# Patient Record
Sex: Male | Born: 1995 | Race: Black or African American | Hispanic: No | Marital: Single | Smoking: Never smoker
Health system: Southern US, Community
[De-identification: ages and names within clinical notes are randomized; demographics above are authoritative.]

## PROBLEM LIST (undated history)

## (undated) HISTORY — PX: NO PAST SURGERIES: SHX2092

---
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
## 2015-10-05 ENCOUNTER — Ambulatory Visit (INDEPENDENT_AMBULATORY_CARE_PROVIDER_SITE_OTHER): Payer: BLUE CROSS/BLUE SHIELD

## 2015-10-05 ENCOUNTER — Ambulatory Visit
Admission: EM | Admit: 2015-10-05 | Discharge: 2015-10-05 | Disposition: A | Discharge status: Home / Self Care | Payer: BLUE CROSS/BLUE SHIELD | Attending: Family Medicine | Admitting: Family Medicine

## 2015-10-05 DIAGNOSIS — S60131A Contusion of right middle finger with damage to nail, initial encounter: Secondary | ICD-10-CM | POA: Diagnosis not present

## 2015-10-05 NOTE — Discharge Instructions (Signed)
° °  Follow up with your primary care physician this week as needed. Return to Urgent care for new or worsening concerns.  ° °

## 2015-10-05 NOTE — ED Triage Notes (Signed)
Patient complains of right hand middle finger injury. Patient states that he slammed his finger in a car door and the pain has progressively got worse. Patient states that area is swollen and discolored.

## 2015-10-05 NOTE — ED Provider Notes (Signed)
[facility] ____________________________________________  Time seen: Approximately 1:00 PM  I have reviewed the triage vital signs and the nursing notes.   HISTORY  Chief Complaint Finger Injury (Right middle )   HPI Richard Love is a 20 y.o. male presents for evaluation of right middle finger pain. Patient reports present 2 weeks ago his right middle finger had a door closed on it. Patient reports he has had pain to the nail area that finger since. Patient reports pain is mild. Patient reports pain is primarily when directly palpated or with something pressing against the area. Reports he is right-hand dominant. Denies any other pain or injuries. Reports has continued to use hand without restrictions.  Denies any numbness or tingling sensation. Denies any decreased range of motion. Denies pain radiation. Denies any other pain. Denies fall, head injury or loss consciousness.   History reviewed. No pertinent past medical history.  There are no active problems to display for this patient.   Past Surgical History:  Procedure Laterality Date  . NO PAST SURGERIES        No current facility-administered medications for this encounter.  No current outpatient prescriptions on file.  Allergies Review of patient's allergies indicates no known allergies.  History reviewed. No pertinent family history.  Social History Social History  Substance Use Topics  . Smoking status: Never Smoker  . Smokeless tobacco: Never Used  . Alcohol use No    Review of Systems Constitutional: No fever/chills Eyes: No visual changes. ENT: No sore throat. Cardiovascular: Denies chest pain. Respiratory: Denies shortness of breath. Gastrointestinal: No abdominal pain.  No nausea, no vomiting.  No diarrhea.  No constipation. Genitourinary: Negative for dysuria. Musculoskeletal: Negative for back pain. Skin: Negative for rash. Neurological: Negative for headaches, focal weakness or  numbness.  10-point ROS otherwise negative.  ____________________________________________   PHYSICAL EXAM:  VITAL SIGNS: ED Triage Vitals  Enc Vitals Group     BP 10/05/15 1104 123/70     Pulse Rate 10/05/15 1104 81     Resp 10/05/15 1104 16     Temp 10/05/15 1104 97.7 F (36.5 C)     Temp Source 10/05/15 1104 Tympanic     SpO2 10/05/15 1104 100 %     Weight 10/05/15 1104 194 lb (88 kg)     Height 10/05/15 1104 5\' 11"  (1.803 m)     Head Circumference --      Peak Flow --      Pain Score 10/05/15 1106 7     Pain Loc --      Pain Edu? --      Excl. in [place]? --     Constitutional: Alert and oriented. Well appearing and in no acute distress. Eyes: Conjunctivae are normal. PERRL. EOMI. ENT      Head: Normocephalic and atraumatic.      Mouth/Throat: Mucous membranes are moist Cardiovascular: Normal rate, regular rhythm. Grossly normal heart sounds.  Good peripheral circulation. Respiratory: Normal respiratory effort without tachypnea nor retractions. Breath sounds are clear and equal bilaterally. No wheezes/rales/rhonchi.. Musculoskeletal: Ambulatory with steady gait. Except: Right third distal digit mild pain to direct palpation at distal phalanx, subungual hematoma noted, skin intact, no motor or tendon deficit, right hand otherwise nontender, bilateral hand grips strong and equal.  Neurologic:  Normal speech and language. No gross focal neurologic deficits are appreciated. Speech is normal. No gait instability.  Skin:  Skin is warm, dry and intact. No rash noted. Psychiatric: Mood and affect are  normal. Speech and behavior are normal. Patient exhibits appropriate insight and judgment   ___________________________________________   LABS (all labs ordered are listed, but only abnormal results are displayed)  Labs Reviewed - No data to display ____________________________________________  RADIOLOGY  Dg Finger Middle Right  Result Date: 10/05/2015 CLINICAL DATA:  Slammed  in car door. Middle finger right hand pain. Initial encounter. EXAM: RIGHT MIDDLE FINGER 2+V COMPARISON:  None. FINDINGS: No acute fracture or dislocation. Chronic ossicle/ calcification at the MCP joint. No opaque foreign body. IMPRESSION: No acute finding Electronically Signed   By: Monte Fantasia M.D.   On: 10/05/2015 11:44   ____________________________________________   PROCEDURES Procedures     INITIAL IMPRESSION / ASSESSMENT AND PLAN / ED COURSE  Pertinent labs & imaging results that were available during my care of the patient were reviewed by me and considered in my medical decision making (see chart for details).  Well appearing patient. No acute distress. Presents for the complaints of 2 weeks of right distal middle finger pain. Patient with right third digit subungual hematoma. Right third digit x-ray no acute finding per radiologist. Discussed in detail with patient encouraged supportive care.  Discussed follow up with Primary care physician this week. Discussed follow up and return parameters including no resolution or any worsening concerns. Patient verbalized understanding and agreed to plan.   ____________________________________________   FINAL CLINICAL IMPRESSION(S) / ED DIAGNOSES  Final diagnoses:  Hematoma, subungual, third finger, right, initial encounter     There are no discharge medications for this patient.   Note: This dictation was prepared with Dragon dictation along with smaller phrase technology. Any transcriptional errors that result from this process are unintentional.    Clinical Course      Marylene Land, NP 10/05/15 1305

## 2018-07-03 ENCOUNTER — Encounter (INDEPENDENT_AMBULATORY_CARE_PROVIDER_SITE_OTHER): Payer: Self-pay

## 2018-07-03 ENCOUNTER — Telehealth: Payer: BLUE CROSS/BLUE SHIELD | Admitting: Nurse Practitioner

## 2018-07-03 DIAGNOSIS — Z20822 Contact with and (suspected) exposure to covid-19: Secondary | ICD-10-CM

## 2018-07-03 MED ORDER — BENZONATATE 100 MG PO CAPS: 100.0000 mg | ORAL_CAPSULE | Freq: Three times a day (TID) | ORAL | 0 refills | Status: DC | PRN

## 2018-07-03 NOTE — Progress Notes (Signed)
E-Visit for Corona Virus Screening   Your current symptoms could be consistent with the coronavirus.  Call your health care provider or local health department to request and arrange formal testing. Many health care providers can now test patients at their office but not all are.  Please quarantine yourself while awaiting your test results.  [place] [phone], [place], [place] or visit [email or web address]     COVID-19 is a respiratory illness with symptoms that are similar to the flu. Symptoms are typically mild to moderate, but there have been cases of severe illness and death due to the virus. The following symptoms may appear 2-14 days after exposure: . Fever . Cough . Shortness of breath or difficulty breathing . Chills . Repeated shaking with chills . Muscle pain . Headache . Sore throat . New loss of taste or smell . Fatigue . Congestion or runny nose . Nausea or vomiting . Diarrhea  It is vitally important that if you feel that you have an infection such as this virus or any other virus that you stay home and away from places where you may spread it to others.  You should self-quarantine for 14 days if you have symptoms that could potentially be coronavirus or have been in close contact a with a person diagnosed with COVID-19 within the last 2 weeks. You should avoid contact with people age 14 and older.   You should wear a mask or cloth face covering over your nose and mouth if you must be around other people or animals, including pets (even at home). Try to stay at least 6 feet away from other people. This will protect the people around you.  You can use medication such as A prescription cough medication called Tessalon Perles 100 mg. You may take 1-2 capsules every 8 hours as needed for cough  You may also take  acetaminophen (Tylenol) as needed for fever.   Reduce your risk of any infection by using the same precautions used for avoiding the common cold or flu:  Marland Kitchen Wash your hands often with soap and warm water for at least 20 seconds.  If soap and water are not readily available, use an alcohol-based hand sanitizer with at least 60% alcohol.  . If coughing or sneezing, cover your mouth and nose by coughing or sneezing into the elbow areas of your shirt or coat, into a tissue or into your sleeve (not your hands). . Avoid shaking hands with others and consider head nods or verbal greetings only. . Avoid touching your eyes, nose, or mouth with unwashed hands.  . Avoid close contact with people who are sick. . Avoid places or events with large numbers of people in one location, like concerts or sporting events. . Carefully consider travel plans you have or are making. . If you are planning any travel outside or inside the [place], visit the CDC's Travelers' Health webpage for the latest health notices. . If you have some symptoms but not all symptoms, continue to monitor at home and seek medical attention if your symptoms worsen. . If you are having a medical emergency, call 911.  HOME CARE . Only take medications as instructed by your medical team. . Drink plenty of fluids and get plenty of rest. . A steam or ultrasonic humidifier can help if you have congestion.   GET HELP RIGHT AWAY IF YOU HAVE EMERGENCY WARNING SIGNS** FOR COVID-19. If you or someone is showing  any of these signs seek emergency medical care immediately. Call 911 or proceed to your closest emergency facility if: . You develop worsening high fever. . Trouble breathing . Bluish lips or face . Persistent pain or pressure in the chest . New confusion . Inability to wake or stay awake . You cough up blood. . Your symptoms become more severe  **This list is not all possible symptoms. Contact your medical provider for any symptoms that are  sever or concerning to you.   MAKE SURE YOU   Understand these instructions.  Will watch your condition.  Will get help right away if you are not doing well or get worse.  Your e-visit answers were reviewed by a board certified advanced clinical practitioner to complete your personal care plan.  Depending on the condition, your plan could have included both over the counter or prescription medications.  If there is a problem please reply once you have received a response from your provider.  Your safety is important to [place].  If you have drug allergies check your prescription carefully.    You can use MyChart to ask questions about today's visit, request a non-urgent call back, or ask for a work or school excuse for 24 hours related to this e-Visit. If it has been greater than 24 hours you will need to follow up with your provider, or enter a new e-Visit to address those concerns. You will get an e-mail in the next two days asking about your experience.  I hope that your e-visit has been valuable and will speed your recovery. Thank you for using e-visits.  5-10 minutes spent reviewing and documenting in chart.

## 2018-07-04 ENCOUNTER — Encounter (INDEPENDENT_AMBULATORY_CARE_PROVIDER_SITE_OTHER): Payer: Self-pay

## 2018-07-05 ENCOUNTER — Telehealth: Payer: Self-pay | Admitting: *Deleted

## 2018-07-05 ENCOUNTER — Encounter (INDEPENDENT_AMBULATORY_CARE_PROVIDER_SITE_OTHER): Payer: Self-pay

## 2018-07-05 NOTE — Telephone Encounter (Signed)
BPA received via MyChart Covid-19 symptom monitoring for new onset diarrhea with worsening weakness and appetite. Attempted to call pt to discuss symptoms but no answer at this time. Unable to leave message on voicemail due to the mailbox being full. MyChart message sent to pt with the following recommendations:For diarrhea and worsening appetite try to drink fluids as tolerated and work you way up to bland solid foods such as crackers, pretzels,soup,bread or boiled starches. Avoid alcohol, caffeine, fatty or spicy foods that could make diarrhea worse. You may also try over the counter medications if diarrhea continues. If experience weakness with inability to stand or if you have to hold on to something  to get your balance please call 911 and seek treatment in the ED. If you are not able to tolerate foods or liquids please contact your PCP.   Advised pt to contact PCP's office if no improvement of symptoms.

## 2018-07-06 ENCOUNTER — Telehealth: Payer: Self-pay | Admitting: *Deleted

## 2018-07-06 ENCOUNTER — Encounter (INDEPENDENT_AMBULATORY_CARE_PROVIDER_SITE_OTHER): Payer: Self-pay

## 2018-07-06 NOTE — Telephone Encounter (Signed)
Attempted to contact pt due to my chart companion response 07/06/2018 of worsening weakness; left message on voicemail.

## 2018-07-07 ENCOUNTER — Encounter (INDEPENDENT_AMBULATORY_CARE_PROVIDER_SITE_OTHER): Payer: Self-pay

## 2018-07-08 ENCOUNTER — Encounter (INDEPENDENT_AMBULATORY_CARE_PROVIDER_SITE_OTHER): Payer: Self-pay

## 2018-07-10 ENCOUNTER — Telehealth: Payer: Self-pay | Admitting: Internal Medicine

## 2018-07-10 ENCOUNTER — Encounter (INDEPENDENT_AMBULATORY_CARE_PROVIDER_SITE_OTHER): Payer: Self-pay

## 2018-07-10 NOTE — Telephone Encounter (Signed)
Pt triggered BPA with new onset of vomiting. Discussed MyChart Covid 19 protocol for vomiting and when to go to ED. Pt verbalized understanding.

## 2018-07-11 ENCOUNTER — Encounter (INDEPENDENT_AMBULATORY_CARE_PROVIDER_SITE_OTHER): Payer: Self-pay

## 2018-07-13 ENCOUNTER — Encounter (INDEPENDENT_AMBULATORY_CARE_PROVIDER_SITE_OTHER): Payer: Self-pay

## 2018-07-13 ENCOUNTER — Telehealth: Payer: Self-pay

## 2018-07-13 NOTE — Telephone Encounter (Signed)
Received MyChart message from patient with complaint of "bad" acid reflux.  Left HIPAA compliant voice message and sent message through Epic for call back.

## 2018-07-14 ENCOUNTER — Encounter (INDEPENDENT_AMBULATORY_CARE_PROVIDER_SITE_OTHER): Payer: Self-pay

## 2018-07-14 ENCOUNTER — Telehealth: Payer: Self-pay | Admitting: *Deleted

## 2018-07-14 NOTE — Telephone Encounter (Signed)
attempted to contact pt due to my chart companion response 07/14/2018 of new weakness; left message on voicemail.

## 2018-08-16 ENCOUNTER — Encounter: Payer: Self-pay | Admitting: Family

## 2018-08-16 ENCOUNTER — Telehealth: Payer: BLUE CROSS/BLUE SHIELD | Admitting: Family

## 2018-08-16 DIAGNOSIS — Z20822 Contact with and (suspected) exposure to covid-19: Secondary | ICD-10-CM

## 2018-08-16 NOTE — Progress Notes (Signed)
E-Visit for Corona Virus Screening   Your current symptoms could be consistent with the coronavirus.  Many health care providers can now test patients at their office but not all are.  [place] has multiple testing sites. For information on our COVID testing locations and hours go to [email or web address]  Please quarantine yourself while awaiting your test results.  We are enrolling you in our [place] for [place] . Daily you will receive a questionnaire within the [place] website. Our COVID 19 response team willl be monitoriing your responses daily.    COVID-19 is a respiratory illness with symptoms that are similar to the flu. Symptoms are typically mild to moderate, but there have been cases of severe illness and death due to the virus. The following symptoms may appear 2-14 days after exposure: . Fever . Cough . Shortness of breath or difficulty breathing . Chills . Repeated shaking with chills . Muscle pain . Headache . Sore throat . New loss of taste or smell . Fatigue . Congestion or runny nose . Nausea or vomiting . Diarrhea  It is vitally important that if you feel that you have an infection such as this virus or any other virus that you stay home and away from places where you may spread it to others.  You should self-quarantine for 14 days if you have symptoms that could potentially be coronavirus or have been in close contact a with a person diagnosed with COVID-19 within the last 2 weeks. You should avoid contact with people age 17 and older.   You should wear a mask or cloth face covering over your nose and mouth if you must be around other people or animals, including pets (even at home). Try to stay at least 6 feet away from other people. This will protect the people around you.   You may also take acetaminophen (Tylenol) as needed for fever.   Reduce your risk of any infection by using the same precautions used for avoiding  the common cold or flu:  Marland Kitchen Wash your hands often with soap and warm water for at least 20 seconds.  If soap and water are not readily available, use an alcohol-based hand sanitizer with at least 60% alcohol.  . If coughing or sneezing, cover your mouth and nose by coughing or sneezing into the elbow areas of your shirt or coat, into a tissue or into your sleeve (not your hands). . Avoid shaking hands with others and consider head nods or verbal greetings only. . Avoid touching your eyes, nose, or mouth with unwashed hands.  . Avoid close contact with people who are sick. . Avoid places or events with large numbers of people in one location, like concerts or sporting events. . Carefully consider travel plans you have or are making. . If you are planning any travel outside or inside the [place], visit the CDC's Travelers' Health webpage for the latest health notices. . If you have some symptoms but not all symptoms, continue to monitor at home and seek medical attention if your symptoms worsen. . If you are having a medical emergency, call 911.  HOME CARE . Only take medications as instructed by your medical team. . Drink plenty of fluids and get plenty of rest. . A steam or ultrasonic humidifier can help if you have congestion.   GET HELP RIGHT AWAY IF YOU HAVE EMERGENCY WARNING SIGNS** FOR COVID-19. If you or someone is showing any of these signs seek emergency medical care immediately.  Call 911 or proceed to your closest emergency facility if: . You develop worsening high fever. . Trouble breathing . Bluish lips or face . Persistent pain or pressure in the chest . New confusion . Inability to wake or stay awake . You cough up blood. . Your symptoms become more severe  **This list is not all possible symptoms. Contact your medical provider for any symptoms that are sever or concerning to you.   MAKE SURE YOU   Understand these instructions.  Will watch your condition.  Will get help  right away if you are not doing well or get worse.  Your e-visit answers were reviewed by a board certified advanced clinical practitioner to complete your personal care plan.  Depending on the condition, your plan could have included both over the counter or prescription medications.  If there is a problem please reply once you have received a response from your provider.  Your safety is important to [place].  If you have drug allergies check your prescription carefully.    You can use MyChart to ask questions about today's visit, request a non-urgent call back, or ask for a work or school excuse for 24 hours related to this e-Visit. If it has been greater than 24 hours you will need to follow up with your provider, or enter a new e-Visit to address those concerns. You will get an e-mail in the next two days asking about your experience.  I hope that your e-visit has been valuable and will speed your recovery. Thank you for using e-visits.   Greater than 5 minutes, yet less than 10 minutes of time have been spent researching, coordinating, and implementing care for this patient today.  Thank you for the details you included in the comment boxes. Those details are very helpful in determining the best course of treatment for you and help [place] to provide the best care.

## 2018-10-21 ENCOUNTER — Telehealth: Payer: BLUE CROSS/BLUE SHIELD | Admitting: Physician Assistant

## 2018-10-21 DIAGNOSIS — Z20828 Contact with and (suspected) exposure to other viral communicable diseases: Secondary | ICD-10-CM

## 2018-10-21 DIAGNOSIS — R058 Other specified cough: Secondary | ICD-10-CM

## 2018-10-21 DIAGNOSIS — Z20822 Contact with and (suspected) exposure to covid-19: Secondary | ICD-10-CM

## 2018-10-21 DIAGNOSIS — R0602 Shortness of breath: Secondary | ICD-10-CM

## 2018-10-21 DIAGNOSIS — R059 Cough, unspecified: Secondary | ICD-10-CM

## 2018-10-21 DIAGNOSIS — R05 Cough: Secondary | ICD-10-CM

## 2018-10-21 MED ORDER — BENZONATATE 100 MG PO CAPS: 100.0000 mg | ORAL_CAPSULE | Freq: Three times a day (TID) | ORAL | 0 refills | Status: DC | PRN

## 2018-10-21 MED ORDER — ALBUTEROL SULFATE HFA 108 (90 BASE) MCG/ACT IN AERS: 2.0000 | INHALATION_SPRAY | RESPIRATORY_TRACT | 0 refills | Status: DC | PRN

## 2018-10-21 NOTE — Progress Notes (Signed)
E-Visit for [employer] Virus Screening   Hi Fed,  I'm sorry you are not feeling well.  Please read below (link to website) to learn where our most convenient testing sites are for you. I have also prescribed a few medications to help you feel better.  You do not need an appointment or an order for the COVID-19 test.   I will send you a work note via Pharmacist, community.   Your current symptoms could be consistent with the coronavirus.  Many health care providers can now test patients at their office but not all are.  [place] has multiple testing sites. For information on our COVID testing locations and hours go to [email or web address]  Please quarantine yourself while awaiting your test results.  We are enrolling you in our [place] for [place] . Daily you will receive a questionnaire within the [place] website. Our COVID 19 response team willl be monitoriing your responses daily.    COVID-19 is a respiratory illness with symptoms that are similar to the flu. Symptoms are typically mild to moderate, but there have been cases of severe illness and death due to the virus. The following symptoms may appear 2-14 days after exposure: . Fever . Cough . Shortness of breath or difficulty breathing . Chills . Repeated shaking with chills . Muscle pain . Headache . Sore throat . New loss of taste or smell . Fatigue . Congestion or runny nose . Nausea or vomiting . Diarrhea  It is vitally important that if you feel that you have an infection such as this virus or any other virus that you stay home and away from places where you may spread it to others.  You should self-quarantine for 14 days if you have symptoms that could potentially be coronavirus or have been in close contact a with a person diagnosed with COVID-19 within the last 2 weeks. You should avoid contact with people age 79 and older.   You should wear a mask or cloth face covering over your nose and mouth  if you must be around other people or animals, including pets (even at home). Try to stay at least 6 feet away from other people. This will protect the people around you.  You can use medication such as A prescription cough medication called Tessalon Perles 100 mg. You may take 1-2 capsules every 8 hours as needed for cough and A prescription inhaler called Albuterol MDI 90 mcg /actuation 2 puffs every 4 hours as needed for shortness of breath, wheezing, cough  You may also take acetaminophen (Tylenol) as needed for fever.   Reduce your risk of any infection by using the same precautions used for avoiding the common cold or flu:  Marland Kitchen Wash your hands often with soap and warm water for at least 20 seconds.  If soap and water are not readily available, use an alcohol-based hand sanitizer with at least 60% alcohol.  . If coughing or sneezing, cover your mouth and nose by coughing or sneezing into the elbow areas of your shirt or coat, into a tissue or into your sleeve (not your hands). . Avoid shaking hands with others and consider head nods or verbal greetings only. . Avoid touching your eyes, nose, or mouth with unwashed hands.  . Avoid close contact with people who are sick. . Avoid places or events with large numbers of people in one location, like concerts or sporting events. . Carefully consider travel plans you have or are making. . If you  are planning any travel outside or inside the [place], visit the CDC's Travelers' Health webpage for the latest health notices. . If you have some symptoms but not all symptoms, continue to monitor at home and seek medical attention if your symptoms worsen. . If you are having a medical emergency, call 911.  HOME CARE . Only take medications as instructed by your medical team. . Drink plenty of fluids and get plenty of rest. . A steam or ultrasonic humidifier can help if you have congestion.   GET HELP RIGHT AWAY IF YOU HAVE EMERGENCY WARNING SIGNS** FOR  COVID-19. If you or someone is showing any of these signs seek emergency medical care immediately. Call 911 or proceed to your closest emergency facility if: . You develop worsening high fever. . Trouble breathing . Bluish lips or face . Persistent pain or pressure in the chest . New confusion . Inability to wake or stay awake . You cough up blood. . Your symptoms become more severe  **This list is not all possible symptoms. Contact your medical provider for any symptoms that are sever or concerning to you.   MAKE SURE YOU   Understand these instructions.  Will watch your condition.  Will get help right away if you are not doing well or get worse.  Your e-visit answers were reviewed by a board certified advanced clinical practitioner to complete your personal care plan.  Depending on the condition, your plan could have included both over the counter or prescription medications.  If there is a problem please reply once you have received a response from your provider.  Your safety is important to [place].  If you have drug allergies check your prescription carefully.    You can use MyChart to ask questions about today's visit, request a non-urgent call back, or ask for a work or school excuse for 24 hours related to this e-Visit. If it has been greater than 24 hours you will need to follow up with your provider, or enter a new e-Visit to address those concerns. You will get an e-mail in the next two days asking about your experience.  I hope that your e-visit has been valuable and will speed your recovery. Thank you for using e-visits.   Greater than 5 minutes, yet less than 10 minutes of time have been spent researching, coordinating and implementing care for this patient today.

## 2018-12-03 ENCOUNTER — Emergency Department
Admission: EM | Admit: 2018-12-03 | Discharge: 2018-12-03 | Disposition: A | Discharge status: Home / Self Care | Payer: Self-pay | Attending: Student | Admitting: Student

## 2018-12-03 ENCOUNTER — Other Ambulatory Visit: Payer: Self-pay

## 2018-12-03 ENCOUNTER — Encounter: Payer: Self-pay | Admitting: Emergency Medicine

## 2018-12-03 DIAGNOSIS — R202 Paresthesia of skin: Secondary | ICD-10-CM | POA: Insufficient documentation

## 2018-12-03 DIAGNOSIS — R6889 Other general symptoms and signs: Secondary | ICD-10-CM

## 2018-12-03 LAB — BASIC METABOLIC PANEL
Anion gap: 11 (ref 5–15)
BUN: 9 mg/dL (ref 6–20)
CO2: 23 mmol/L (ref 22–32)
Calcium: 9.3 mg/dL (ref 8.9–10.3)
Chloride: 105 mmol/L (ref 98–111)
Creatinine, Ser: 0.71 mg/dL (ref 0.61–1.24)
GFR calc Af Amer: >60 mL/min (ref >60)
GFR calc non Af Amer: >60 mL/min (ref >60)
Glucose, Bld: 136 mg/dL — ABNORMAL HIGH (ref 70–99)
Potassium: 3.2 mmol/L — ABNORMAL LOW (ref 3.5–5.1)
Sodium: 139 mmol/L (ref 135–145)

## 2018-12-03 LAB — CBC WITH DIFFERENTIAL/PLATELET
Abs Immature Granulocytes: 0.03 10*3/uL (ref 0.00–0.07)
Basophils Absolute: 0 10*3/uL (ref 0.0–0.1)
Basophils Relative: 0 %
Eosinophils Absolute: 0 10*3/uL (ref 0.0–0.5)
Eosinophils Relative: 0 %
HCT: 41.1 % (ref 39.0–52.0)
Hemoglobin: 14.7 g/dL (ref 13.0–17.0)
Immature Granulocytes: 0 %
Lymphocytes Relative: 18 %
Lymphs Abs: 1.9 10*3/uL (ref 0.7–4.0)
MCH: 30.4 pg (ref 26.0–34.0)
MCHC: 35.8 g/dL (ref 30.0–36.0)
MCV: 85.1 fL (ref 80.0–100.0)
Monocytes Absolute: 0.8 10*3/uL (ref 0.1–1.0)
Monocytes Relative: 8 %
Neutro Abs: 7.7 10*3/uL (ref 1.7–7.7)
Neutrophils Relative %: 74 %
Platelets: 311 10*3/uL (ref 150–400)
RBC: 4.83 MIL/uL (ref 4.22–5.81)
RDW: 12.3 % (ref 11.5–15.5)
WBC: 10.5 10*3/uL (ref 4.0–10.5)
nRBC: 0 % (ref 0.0–0.2)

## 2018-12-03 MED ORDER — KETOROLAC TROMETHAMINE 30 MG/ML IJ SOLN
15.0000 mg | Freq: Once | INTRAMUSCULAR | Status: AC
  Administered 2018-12-03: 15 mg via INTRAVENOUS
  Filled 2018-12-03: qty 1

## 2018-12-03 MED ORDER — SODIUM CHLORIDE 0.9 % IV BOLUS
1000.0000 mL | Freq: Once | INTRAVENOUS | Status: AC
  Administered 2018-12-03: 17:00:00 1000 mL via INTRAVENOUS

## 2018-12-03 MED ORDER — POTASSIUM CHLORIDE CRYS ER 20 MEQ PO TBCR
40.0000 meq | EXTENDED_RELEASE_TABLET | Freq: Once | ORAL | Status: AC
  Administered 2018-12-03: 17:00:00 40 meq via ORAL
  Filled 2018-12-03: qty 2

## 2018-12-03 NOTE — ED Triage Notes (Signed)
Pt to ED via POV c/o tingling in bilateral hands and feet. Pt states that this has been going on for a few days but it got worse today. Pt states that he is also having headache and lightheadedness. Pt is in NAD at this time.

## 2018-12-03 NOTE — ED Notes (Signed)
EDP [facility] at bedside. EKG to [facility].

## 2018-12-03 NOTE — Discharge Instructions (Addendum)
Please eat a healthy, balanced diet.  Please obtain an over-the-counter multivitamin (that includes [id number] and folic acid) and take daily as directed.  Follow up with your general doctor.   Return to the ER for any new or worsening symptoms.

## 2018-12-03 NOTE — ED Provider Notes (Signed)
[facility] Emergency Department Provider Note  ____________________________________________   First MD Initiated Contact with Patient 12/03/18 1654     (approximate)  I have reviewed the triage vital signs and the nursing notes.  History  Chief Complaint Tingling    HPI Richard Love is a 23 y.o. male who presents emergency department with multiple complaints.  Patient states he started feeling generally unwell a few days ago, but could not identify and specific symptoms.  Today when he was driving his car he felt like his hands and his feet were tingling, which prompted him to seek care.  He specifically denies any associated headache, vision changes, chest pain, weakness.  He denies any fevers, cough, exposure to sick contacts.  He did feel mild shortness of breath earlier, for which he has used his inhaler. He denies any SOB currently. He denies any carbon monoxide exposure, does not use a generator for heat.  He states he only eats fast food, and eats very little fruits or vegetables.   Past Medical Hx History reviewed. No pertinent past medical history.  Problem List There are no active problems to display for this patient.   Past Surgical Hx Past Surgical History:  Procedure Laterality Date  . NO PAST SURGERIES      Medications Prior to Admission medications   Medication Sig Start Date End Date Taking? Authorizing Provider  albuterol (VENTOLIN HFA) 108 (90 Base) MCG/ACT inhaler Inhale 2 puffs into the lungs every 4 (four) hours as needed for wheezing or shortness of breath (cough, shortness of breath or wheezing.). 10/21/18   McVey, Gelene Mink, PA-C  benzonatate (TESSALON PERLES) 100 MG capsule Take 1 capsule (100 mg total) by mouth 3 (three) times daily as needed. 10/21/18   McVey, Gelene Mink, PA-C    Allergies Patient has no known allergies.  Family Hx No family history on file.  Social Hx Social History   Tobacco Use   . Smoking status: Never Smoker  . Smokeless tobacco: Never Used  Substance Use Topics  . Alcohol use: No  . Drug use: No     Review of Systems  Constitutional: Negative for fever, chills. + generally unwell Eyes: Negative for visual changes. ENT: Negative for sore throat. Cardiovascular: Negative for chest pain. Respiratory: Negative for shortness of breath. Gastrointestinal: Negative for nausea, vomiting.  Genitourinary: Negative for dysuria. Musculoskeletal: Negative for leg swelling. Skin: Negative for rash. Neurological: Negative for for headaches. + tingling   Physical Exam  Vital Signs: ED Triage Vitals  Enc Vitals Group     BP 12/03/18 1551 (!) 164/85     Pulse Rate 12/03/18 1551 (!) 113     Resp 12/03/18 1551 16     Temp 12/03/18 1551 99.4 F (37.4 C)     Temp Source 12/03/18 1551 Oral     SpO2 12/03/18 1551 98 %     Weight 12/03/18 1552 200 lb (90.7 kg)     Height 12/03/18 1552 5\' 11"  (1.803 m)     Head Circumference --      Peak Flow --      Pain Score 12/03/18 1551 0     Pain Loc --      Pain Edu? --      Excl. in [place]? --     Constitutional: Alert and oriented.  Head: Normocephalic. Atraumatic. Eyes: Conjunctivae clear. Sclera anicteric. Nose: No congestion. No rhinorrhea. Mouth/Throat: Wearing mask.  Neck: No stridor.   Cardiovascular: Normal rate, regular  rhythm. Extremities well perfused. Respiratory: Normal respiratory effort.  Lungs CTAB. Gastrointestinal: Soft. Non-tender. Non-distended.  Musculoskeletal: No lower extremity edema. No deformities. Neurologic:  Normal speech and language. No gross focal neurologic deficits are appreciated. Alert and oriented.  Face symmetric.  Tongue midline.  Cranial nerves II through XII intact. UE and LE strength 5/5 and symmetric. UE and LE SILT.  Skin: Skin is warm, dry and intact. No rash noted. Psychiatric: Mood and affect are appropriate for situation.  EKG  Personally reviewed.   Rate: 91 Rhythm:  sinus Axis: normal Intervals: WNL No evidence of Brugada, WPW, or prolonged QTc No STEMI    Radiology  N/A   Procedures  Procedure(s) performed (including critical care):  Procedures   Initial Impression / Assessment and Plan / ED Course  23 y.o. male who presents to the ED for feeling generally unwell, and tingling in his hands/feet.  Ddx: electrolyte abnormality, dehydration, substance use. He does report very unhealthy diet and could have a B12 or folate deficiency as etiology of his tingling. He has not had an CO exposure.  Labs reveal mild hypokalemia. Will replete and give IVF. Remainder of labs w/o actionable derangements. EKG w/o ischemia or arrhythmia. Advised improving his diet, adequate hydration, and addition of a multivitamin. He voices understanding and is comfortable with the plan. Stable for discharge.    Final Clinical Impression(s) / ED Diagnosis  Final diagnoses:  Tingling  Feeling unwell       Note:  This document was prepared using Dragon voice recognition software and may include unintentional dictation errors.   Lilia Pro., MD 12/03/18 2125

## 2018-12-03 NOTE — ED Notes (Addendum)
See triage note. Pt reports episodes of tingling in both hands and lightheadedness. Denies HA, vision changes, and CP. Pt calmly sitting in bed. Pt in NAD. A&Ox4. Resp: reg/unlabored.

## 2019-01-03 ENCOUNTER — Inpatient Hospital Stay
Admission: RE | Admit: 2019-01-03 | Discharge: 2019-01-03 | Disposition: A | Discharge status: Home / Self Care | Payer: Medicaid Other | Source: Ambulatory Visit

## 2019-01-04 ENCOUNTER — Encounter: Payer: Self-pay | Admitting: Emergency Medicine

## 2019-01-04 ENCOUNTER — Other Ambulatory Visit: Payer: Self-pay

## 2019-01-04 ENCOUNTER — Ambulatory Visit: Payer: Self-pay

## 2019-01-04 ENCOUNTER — Ambulatory Visit
Admission: EM | Admit: 2019-01-04 | Discharge: 2019-01-04 | Disposition: A | Discharge status: Home / Self Care | Payer: Self-pay | Attending: Emergency Medicine | Admitting: Emergency Medicine

## 2019-01-04 DIAGNOSIS — R109 Unspecified abdominal pain: Secondary | ICD-10-CM

## 2019-01-04 DIAGNOSIS — K5900 Constipation, unspecified: Secondary | ICD-10-CM

## 2019-01-04 LAB — CBC WITH DIFFERENTIAL/PLATELET
Abs Immature Granulocytes: 0.02 10*3/uL (ref 0.00–0.07)
Basophils Absolute: 0 10*3/uL (ref 0.0–0.1)
Basophils Relative: 0 %
Eosinophils Absolute: 0.1 10*3/uL (ref 0.0–0.5)
Eosinophils Relative: 1 %
HCT: 42.8 % (ref 39.0–52.0)
Hemoglobin: 14.8 g/dL (ref 13.0–17.0)
Immature Granulocytes: 0 %
Lymphocytes Relative: 20 %
Lymphs Abs: 1.6 10*3/uL (ref 0.7–4.0)
MCH: 30.2 pg (ref 26.0–34.0)
MCHC: 34.6 g/dL (ref 30.0–36.0)
MCV: 87.3 fL (ref 80.0–100.0)
Monocytes Absolute: 0.6 10*3/uL (ref 0.1–1.0)
Monocytes Relative: 8 %
Neutro Abs: 5.7 10*3/uL (ref 1.7–7.7)
Neutrophils Relative %: 71 %
Platelets: 301 10*3/uL (ref 150–400)
RBC: 4.9 MIL/uL (ref 4.22–5.81)
RDW: 12.7 % (ref 11.5–15.5)
WBC: 8 10*3/uL (ref 4.0–10.5)
nRBC: 0 % (ref 0.0–0.2)

## 2019-01-04 LAB — BASIC METABOLIC PANEL
Anion gap: 13 (ref 5–15)
BUN: 13 mg/dL (ref 6–20)
CO2: 22 mmol/L (ref 22–32)
Calcium: 9.6 mg/dL (ref 8.9–10.3)
Chloride: 101 mmol/L (ref 98–111)
Creatinine, Ser: 0.69 mg/dL (ref 0.61–1.24)
GFR calc Af Amer: >60 mL/min (ref >60)
GFR calc non Af Amer: >60 mL/min (ref >60)
Glucose, Bld: 95 mg/dL (ref 70–99)
Potassium: 4 mmol/L (ref 3.5–5.1)
Sodium: 136 mmol/L (ref 135–145)

## 2019-01-04 MED ORDER — DOCUSATE SODIUM 100 MG PO CAPS: 100.0000 mg | ORAL_CAPSULE | Freq: Two times a day (BID) | ORAL | 0 refills | Status: DC

## 2019-01-04 NOTE — Discharge Instructions (Addendum)
Take medication as prescribed. Rest. Drink plenty of fluids. Monitor dietary habits and eat well balanced diet.  Over-the-counter Metamucil as needed.  Follow up with your primary care physician this week.  Return to Urgent care or ER for new or worsening concerns.

## 2019-01-04 NOTE — ED Provider Notes (Signed)
[facility] ____________________________________________  Time seen: Approximately 11:43 AM  I have reviewed the triage vital signs and the nursing notes.   HISTORY  Chief Complaint Abdominal Pain and Constipation   HPI Richard Love Richard Love is a 23 y.o. male presenting for evaluation of abdominal complaints.  Patient reports for the last 2 to 3 weeks he has been having intermittent constipation.  States this comes and goes every few days.  States he has an easy and somewhat of a bloated feeling in his lower stomach.  Last bowel movement was yesterday and reports that it was hard and he had a strain as well as not a lot came out.  Did try one laxative last week without success in alleviating symptoms.  In discussing his diet, reports he has a poor diet of mostly fast food, does not cook at home.  Also has not been drinking a lot of water.  Denies cough, congestion, chest pain, shortness of breath, fevers, changes in taste or smell.  Denies nausea, vomiting or diarrhea.  Denies abnormal colored stools, blood or melena.  No previous abdominal issues or abdominal surgeries.  Some mild lower abdominal discomfort now.  Reports otherwise doing well.   History reviewed. No pertinent past medical history.  There are no problems to display for this patient.   Past Surgical History:  Procedure Laterality Date  . NO PAST SURGERIES       No current facility-administered medications for this encounter.  Current Outpatient Medications:  .  docusate sodium (COLACE) 100 MG capsule, Take 1 capsule (100 mg total) by mouth every 12 (twelve) hours., Disp: 60 capsule, Rfl: 0  Allergies Patient has no known allergies.  Family History  Problem Relation Age of Onset  . Healthy Mother   . Hypertension Father   . Diabetes Father     Social History Social History   Tobacco Use  . Smoking status: Never Smoker  . Smokeless tobacco: Never Used  Substance Use Topics  . Alcohol use: Yes   Comment: rarely  . Drug use: No    Review of Systems Constitutional: No fever ENT: No sore throat. Cardiovascular: Denies chest pain. Respiratory: Denies shortness of breath. Gastrointestinal: As above.  Genitourinary: Negative for dysuria. Musculoskeletal: Negative for back pain. Skin: Negative for rash.   ____________________________________________   PHYSICAL EXAM:  VITAL SIGNS: ED Triage Vitals [01/04/19 1059]  Enc Vitals Group     BP 137/88     Pulse Rate 100     Resp 16     Temp 98.5 F (36.9 C)     Temp Source Oral     SpO2 98 %     Weight 212 lb (96.2 kg)     Height 5\' 11"  (1.803 m)     Head Circumference      Peak Flow      Pain Score 6     Pain Loc      Pain Edu?      Excl. in [place]?     Constitutional: Alert and oriented. Well appearing and in no acute distress. Eyes: Conjunctivae are normal.  ENT      Head: Normocephalic and atraumatic. Cardiovascular: Normal rate, regular rhythm. Grossly normal heart sounds.  Good peripheral circulation. Respiratory: Normal respiratory effort without tachypnea nor retractions. Breath sounds are clear and equal bilaterally. No wheezes, rales, rhonchi. Gastrointestinal: No distention. Normal Bowel sounds.  Minimal to mild diffuse lower abdominal tenderness to palpation, mostly left lower quadrant.  No CVA tenderness.  Musculoskeletal:  Steady gait.  Neurologic:  Normal speech and language. Speech is normal. No gait instability.  Skin:  Skin is warm, dry and intact. No rash noted. Psychiatric: Mood and affect are normal. Speech and behavior are normal. Patient exhibits appropriate insight and judgment   ___________________________________________   LABS (all labs ordered are listed, but only abnormal results are displayed)  Labs Reviewed  CBC WITH DIFFERENTIAL/PLATELET  BASIC METABOLIC PANEL    RADIOLOGY  DG Abd 2 Views  Result Date: 01/04/2019 CLINICAL DATA:  Lower abdominal pain EXAM: ABDOMEN - 2 VIEW  COMPARISON:  None. FINDINGS: The bowel gas pattern is normal. There is no evidence of free air. No radio-opaque calculi or other significant radiographic abnormality is seen. The colon is decompressed. IMPRESSION: Negative. Electronically Signed   By: Macy Mis M.D.   On: 01/04/2019 12:01   ____________________________________________   PROCEDURES Procedures   INITIAL IMPRESSION / ASSESSMENT AND PLAN / ED COURSE  Pertinent labs & imaging results that were available during my care of the patient were reviewed by me and considered in my medical decision making (see chart for details).  Very well-appearing patient.  No acute distress.  Suspect constipation.  Abdominal x-ray as above per radiologist, negative.  CBC, BMP unremarkable.  Recommend treating with Colace daily and Metamucil as needed.  Strongly recommend patient adjusting change his diet as he is majorly consuming only fast food.  Patient agrees with this plan.  Rest, fluids, supportive care.Discussed indication, risks and benefits of medications with patient.   Discussed follow up with Primary care physician this week. Discussed follow up and return parameters including no resolution or any worsening concerns. Patient verbalized understanding and agreed to plan.   ____________________________________________   FINAL CLINICAL IMPRESSION(S) / ED DIAGNOSES  Final diagnoses:  Constipation, unspecified constipation type  Abdominal pain, unspecified abdominal location     ED Discharge Orders         Ordered    docusate sodium (COLACE) 100 MG capsule  Every 12 hours     01/04/19 1233           Note: This dictation was prepared with Dragon dictation along with smaller phrase technology. Any transcriptional errors that result from this process are unintentional.         Marylene Land, NP 01/04/19 1253

## 2019-01-04 NOTE — ED Triage Notes (Signed)
Patient in today c/o abdominal pain, diarrhea and loss of appetite x 2 weeks. Patient's last BM was yesterday, but patient states it was not a normal BM for him.

## 2019-01-14 ENCOUNTER — Telehealth: Payer: Medicaid Other | Admitting: Nurse Practitioner

## 2019-01-14 DIAGNOSIS — R590 Localized enlarged lymph nodes: Secondary | ICD-10-CM

## 2019-01-14 DIAGNOSIS — R52 Pain, unspecified: Secondary | ICD-10-CM

## 2019-01-14 NOTE — Progress Notes (Signed)
E-Visit for Corona Virus Screening   Your current symptoms could be consistent with the coronavirus.  Many health care providers can now test patients at their office but not all are.  [facility] has multiple testing sites. For information on our COVID testing locations and hours go to [email or web address]  If your covid test comes back negative you may need a face to face visit o determine if lymphnode mean anything else.  We are enrolling you in our [place] for [place] . Daily you will receive a questionnaire within the [facility] website. Our COVID 19 response team will be monitoring your responses daily.  Testing Information: The COVID-19 Community Testing sites will begin testing BY APPOINTMENT ONLY.  You can schedule online at [email or web address]  If you do not have access to a smart phone or computer you may call [phone] for an appointment.  Testing Locations: Appointment schedule is 8 am to 3:30 pm at all sites  [facility] indoors at [address], [place] [address] [facility]  indoors at [place]. [address] [place], [place] [address] [place] indoors at [address], [place] [address]  Additional testing sites in the Community:  . For [facility] Testing sites in [facility]  FaceUpdate.uy  . For Pop-up testing sites in [place]  BowlDirectory.co.uk  . For Testing sites with regular hours https://onsms.org/[place]/  . For [place] RenewablesAnalytics.si  . For [facility] BasicJet.ca  . For [facility] testing in [place] and [employer]  BasicJet.[email or web address]  . For [facility] testing in [facility]   [email or web address].[email or web address]  For  more information about community testing call [phone]   We are enrolling you in our [place] for [place] . Daily you will receive a questionnaire within the [facility] website. Our COVID 19 response team will be monitoring your responses daily.  Please quarantine yourself while awaiting your test results. If you develop fever/cough/breathlessness, please stay home for 10 days with improving symptoms and until you have had 24 hours of no fever (without taking a fever reducer).  You should wear a mask or cloth face covering over your nose and mouth if you must be around other people or animals, including pets (even at home). Try to stay at least 6 feet away from other people. This will protect the people around you.  Please continue good preventive care measures, including:  frequent hand-washing, avoid touching your face, cover coughs/sneezes, stay out of crowds and keep a 6 foot distance from others.  COVID-19 is a respiratory illness with symptoms that are similar to the flu. Symptoms are typically mild to moderate, but there have been cases of severe illness and death due to the virus.   The following symptoms may appear 2-14 days after exposure: . Fever . Cough . Shortness of breath or difficulty breathing . Chills . Repeated shaking with chills . Muscle pain . Headache . Sore throat . New loss of taste or smell . Fatigue . Congestion or runny nose . Nausea or vomiting . Diarrhea  Go to the nearest hospital ED for assessment if fever/cough/breathlessness are severe or illness seems like a threat to life.  It is vitally important that if you feel that you have an infection such as this virus or any other virus that you stay home and away from places where you may spread it to  others.  You should avoid contact with people age 74 and older.   You  can use medication such as delsym OTC if you develop a cough  You may also take acetaminophen (Tylenol) as needed for fever.  Reduce your risk of any infection by using the same precautions used for avoiding the common cold or flu:  Marland Kitchen Wash your hands often with soap and warm water for at least 20 seconds.  If soap and water are not readily available, use an alcohol-based hand sanitizer with at least 60% alcohol.  . If coughing or sneezing, cover your mouth and nose by coughing or sneezing into the elbow areas of your shirt or coat, into a tissue or into your sleeve (not your hands). . Avoid shaking hands with others and consider head nods or verbal greetings only. . Avoid touching your eyes, nose, or mouth with unwashed hands.  . Avoid close contact with people who are sick. . Avoid places or events with large numbers of people in one location, like concerts or sporting events. . Carefully consider travel plans you have or are making. . If you are planning any travel outside or inside the [place], visit the CDC's Travelers' Health webpage for the latest health notices. . If you have some symptoms but not all symptoms, continue to monitor at home and seek medical attention if your symptoms worsen. . If you are having a medical emergency, call 911.  HOME CARE . Only take medications as instructed by your medical team. . Drink plenty of fluids and get plenty of rest. . A steam or ultrasonic humidifier can help if you have congestion.   GET HELP RIGHT AWAY IF YOU HAVE EMERGENCY WARNING SIGNS** FOR COVID-19. If you or someone is showing any of these signs seek emergency medical care immediately. Call 911 or proceed to your closest emergency facility if: . You develop worsening high fever. . Trouble breathing . Bluish lips or face . Persistent pain or pressure in the chest . New confusion . Inability to wake or stay awake . You  cough up blood. . Your symptoms become more severe  **This list is not all possible symptoms. Contact your medical provider for any symptoms that are sever or concerning to you.  MAKE SURE YOU   Understand these instructions.  Will watch your condition.  Will get help right away if you are not doing well or get worse.  Your e-visit answers were reviewed by a board certified advanced clinical practitioner to complete your personal care plan.  Depending on the condition, your plan could have included both over the counter or prescription medications.  If there is a problem please reply once you have received a response from your provider.  Your safety is important to [place].  If you have drug allergies check your prescription carefully.    You can use MyChart to ask questions about today's visit, request a non-urgent call back, or ask for a work or school excuse for 24 hours related to this e-Visit. If it has been greater than 24 hours you will need to follow up with your provider, or enter a new e-Visit to address those concerns. You will get an e-mail in the next two days asking about your experience.  I hope that your e-visit has been valuable and will speed your recovery. Thank you for using e-visits.   5-10 minutes spent reviewing and documenting in chart.

## 2019-01-15 ENCOUNTER — Encounter: Payer: Self-pay | Admitting: Gynecology

## 2019-01-15 ENCOUNTER — Ambulatory Visit
Admission: EM | Admit: 2019-01-15 | Discharge: 2019-01-15 | Disposition: A | Discharge status: Home / Self Care | Payer: BLUE CROSS/BLUE SHIELD | Attending: Family Medicine | Admitting: Family Medicine

## 2019-01-15 ENCOUNTER — Other Ambulatory Visit: Payer: Self-pay

## 2019-01-15 DIAGNOSIS — R109 Unspecified abdominal pain: Secondary | ICD-10-CM | POA: Insufficient documentation

## 2019-01-15 DIAGNOSIS — R590 Localized enlarged lymph nodes: Secondary | ICD-10-CM | POA: Insufficient documentation

## 2019-01-15 DIAGNOSIS — Z79899 Other long term (current) drug therapy: Secondary | ICD-10-CM | POA: Diagnosis not present

## 2019-01-15 DIAGNOSIS — R591 Generalized enlarged lymph nodes: Secondary | ICD-10-CM

## 2019-01-15 DIAGNOSIS — F5089 Other specified eating disorder: Secondary | ICD-10-CM | POA: Diagnosis not present

## 2019-01-15 DIAGNOSIS — R1013 Epigastric pain: Secondary | ICD-10-CM | POA: Diagnosis not present

## 2019-01-15 DIAGNOSIS — R5383 Other fatigue: Secondary | ICD-10-CM | POA: Diagnosis not present

## 2019-01-15 DIAGNOSIS — Z20822 Contact with and (suspected) exposure to covid-19: Secondary | ICD-10-CM | POA: Insufficient documentation

## 2019-01-15 DIAGNOSIS — R599 Enlarged lymph nodes, unspecified: Secondary | ICD-10-CM

## 2019-01-15 MED ORDER — OMEPRAZOLE 40 MG PO CPDR: 40.0000 mg | DELAYED_RELEASE_CAPSULE | Freq: Every day | ORAL | 0 refills | Status: DC

## 2019-01-15 NOTE — ED Triage Notes (Signed)
Patient c/o x 4 days with abdominal pain / loss of appetite.

## 2019-01-15 NOTE — ED Provider Notes (Signed)
[facility]    CSN: [id number] Arrival date & time: 01/15/19  1419  History   Chief Complaint Chief Complaint  Patient presents with  . Abdominal Pain   HPI  24 year old male presents with multiple complaints.  Patient reports ongoing abdominal pain.  Patient reports that he has had loss of appetite.  Patient also had an ED visit yesterday in which he reported cervical lymphadenopathy as well as body aches.    Patient recently seen on 12/22 for abdominal pain which was suspected to be from constipation.  Patient reports that his abdominal pain improved and then worsened again 4 days ago.  He localizes the pain to the epigastric region.  Rates his pain as 5/10 in severity.  No fever.  He is well-appearing.  Patient reports ongoing fatigue.  He also reports an area of adenopathy in the posterior neck.  He was advised to get a Covid test yesterday via his ED visit and he did not do so.  Patient presents for evaluation today.  No known exacerbating or relieving factors.  No other complaints.  PMH, Surgical Hx, Family Hx, Social History reviewed and updated as below.  No significant PMH.  Past Surgical History:  Procedure Laterality Date  . NO PAST SURGERIES     Home Medications    Prior to Admission medications   Medication Sig Start Date End Date Taking? Authorizing Provider  omeprazole (PRILOSEC) 40 MG capsule Take 1 capsule (40 mg total) by mouth daily. 01/15/19   Coral Spikes, DO  albuterol (VENTOLIN HFA) 108 (90 Base) MCG/ACT inhaler Inhale 2 puffs into the lungs every 4 (four) hours as needed for wheezing or shortness of breath (cough, shortness of breath or wheezing.). 10/21/18 01/04/19  McVey, Gelene Mink, PA-C    Family History Family History  Problem Relation Age of Onset  . Healthy Mother   . Hypertension Father   . Diabetes Father     Social History Social History   Tobacco Use  . Smoking status: Never Smoker  . Smokeless tobacco: Never Used   Substance Use Topics  . Alcohol use: Yes    Comment: rarely  . Drug use: No     Allergies   Patient has no known allergies.   Review of Systems Review of Systems  Constitutional: Positive for appetite change and fatigue. Negative for fever.  Gastrointestinal: Positive for abdominal pain.  Hematological: Positive for adenopathy.   Physical Exam Triage Vital Signs ED Triage Vitals  Enc Vitals Group     BP 01/15/19 1458 132/81     Pulse Rate 01/15/19 1458 92     Resp 01/15/19 1458 16     Temp --      Temp Source 01/15/19 1458 Oral     SpO2 01/15/19 1458 98 %     Weight 01/15/19 1457 220 lb (99.8 kg)     Height 01/15/19 1457 5\' 11"  (1.803 m)     Head Circumference --      Peak Flow --      Pain Score 01/15/19 1457 5     Pain Loc --      Pain Edu? --      Excl. in [place]? --    Updated Vital Signs BP 132/81 (BP Location: Left Arm)   Pulse 92   Resp 16   Ht 5\' 11"  (1.803 m)   Wt 99.8 kg   SpO2 98%   BMI 30.68 kg/m   Visual Acuity Right Eye Distance:  Left Eye Distance:   Bilateral Distance:    Right Eye Near:   Left Eye Near:    Bilateral Near:     Physical Exam Vitals and nursing note reviewed.  Constitutional:      General: He is not in acute distress.    Appearance: Normal appearance. He is not ill-appearing.  HENT:     Head: Normocephalic and atraumatic.  Eyes:     General:        Right eye: No discharge.        Left eye: No discharge.     Conjunctiva/sclera: Conjunctivae normal.  Cardiovascular:     Rate and Rhythm: Normal rate and regular rhythm.     Heart sounds: No murmur.  Pulmonary:     Effort: Pulmonary effort is normal.     Breath sounds: Normal breath sounds. No wheezing, rhonchi or rales.  Abdominal:     General: There is no distension.     Palpations: Abdomen is soft.     Tenderness: There is no abdominal tenderness. There is no guarding or rebound.  Neurological:     Mental Status: He is alert.  Psychiatric:     Comments: Flat  affect.  Depressed mood.    UC Treatments / Results  Labs (all labs ordered are listed, but only abnormal results are displayed) Labs Reviewed  NOVEL CORONAVIRUS, NAA (HOSP ORDER, SEND-OUT TO REF LAB; TAT 18-24 HRS)    EKG   Radiology No results found.  Procedures Procedures (including critical care time)  Medications Ordered in UC Medications - No data to display  Initial Impression / Assessment and Plan / UC Course  I have reviewed the triage vital signs and the nursing notes.  Pertinent labs & imaging results that were available during my care of the patient were reviewed by me and considered in my medical decision making (see chart for details).    24 year old male presents with multiple complaints.  Etiology uncertain at this time.  Given epigastric tenderness, placing on omeprazole.  Awaiting Covid test results.  Supportive care.  Work note given.  Final Clinical Impressions(s) / UC Diagnoses   Final diagnoses:  Abdominal pain, epigastric  Fatigue, unspecified type  Adenopathy     Discharge Instructions     Results available in 24 to 48 hours.  Medication as prescribed.  Stay home.  Follow up with your PCP.  Take care  Dr. Lacinda Axon     ED Prescriptions    Medication Sig Dispense Auth. Provider   omeprazole (PRILOSEC) 40 MG capsule Take 1 capsule (40 mg total) by mouth daily. 30 capsule Coral Spikes, DO     PDMP not reviewed this encounter.   Coral Spikes, [place] 01/15/19 1722

## 2019-01-15 NOTE — Discharge Instructions (Signed)
Results available in 24 to 48 hours.  Medication as prescribed.  Stay home.  Follow up with your PCP.  Take care  Dr. Lacinda Axon

## 2019-01-16 LAB — NOVEL CORONAVIRUS, NAA (HOSP ORDER, SEND-OUT TO REF LAB; TAT 18-24 HRS): SARS-CoV-2, NAA: NOT DETECTED

## 2019-01-17 ENCOUNTER — Ambulatory Visit
Admission: EM | Admit: 2019-01-17 | Discharge: 2019-01-17 | Disposition: A | Discharge status: Home / Self Care | Payer: BLUE CROSS/BLUE SHIELD | Attending: Nurse Practitioner | Admitting: Nurse Practitioner

## 2019-01-17 ENCOUNTER — Encounter: Payer: Self-pay | Admitting: Emergency Medicine

## 2019-01-17 ENCOUNTER — Other Ambulatory Visit: Payer: Self-pay

## 2019-01-17 DIAGNOSIS — R6883 Chills (without fever): Secondary | ICD-10-CM

## 2019-01-17 DIAGNOSIS — R531 Weakness: Secondary | ICD-10-CM | POA: Diagnosis not present

## 2019-01-17 DIAGNOSIS — R110 Nausea: Secondary | ICD-10-CM

## 2019-01-17 DIAGNOSIS — R5383 Other fatigue: Secondary | ICD-10-CM

## 2019-01-17 DIAGNOSIS — M791 Myalgia, unspecified site: Secondary | ICD-10-CM | POA: Diagnosis not present

## 2019-01-17 DIAGNOSIS — R519 Headache, unspecified: Secondary | ICD-10-CM

## 2019-01-17 LAB — TSH: TSH: 0.934 u[IU]/mL (ref 0.350–4.500)

## 2019-01-17 LAB — MONONUCLEOSIS SCREEN: Mono Screen: NEGATIVE

## 2019-01-17 MED ORDER — ONDANSETRON 8 MG PO TBDP: 8.0000 mg | ORAL_TABLET | Freq: Three times a day (TID) | ORAL | 0 refills | Status: DC | PRN

## 2019-01-17 NOTE — ED Provider Notes (Signed)
[facility]    CSN: [id number] Arrival date & time: 01/17/19  1648      History   Chief Complaint Chief Complaint  Patient presents with   Fatigue   Adenopathy    HPI Richard Love is a 24 y.o. male.   Subjective:   Richard Love is a 24 y.o. male who presents for evaluation of fatigue. Symptoms began several days ago. Symptoms of his fatigue have been change in appetite, general malaise, swollen glands, headaches, abdominal pain, chills, nausea and lack of interest in usual activities. Patient denies significant change in weight, unusual rashes, fevers, sore throat, neck stiffness, vomiting, diarrhea, constipation or dizziness. Symptoms have waxed and waned. Symptom severity: symptoms bothersome, but easily able to carry out all usual work/school/family activities. Previous visits for this problem: yes, last seen on 01/04/19 and 01/15/19 for similar complaints by the Urgent Care. COVID testing from 01/02 was negative.   The following portions of the patient's history were reviewed and updated as appropriate: allergies, current medications, past family history, past medical history, past social history, past surgical history and problem list.       History reviewed. No pertinent past medical history.  There are no problems to display for this patient.   Past Surgical History:  Procedure Laterality Date   NO PAST SURGERIES         Home Medications    Prior to Admission medications   Medication Sig Start Date End Date Taking? Authorizing Provider  omeprazole (PRILOSEC) 40 MG capsule Take 1 capsule (40 mg total) by mouth daily. 01/15/19  Yes Cook, Jayce G, DO  ondansetron (ZOFRAN ODT) 8 MG disintegrating tablet Take 1 tablet (8 mg total) by mouth every 8 (eight) hours as needed for nausea or vomiting. 01/17/19   Enrique Sack, FNP  albuterol (VENTOLIN HFA) 108 (90 Base) MCG/ACT inhaler Inhale 2 puffs into the lungs every 4 (four) hours as needed for  wheezing or shortness of breath (cough, shortness of breath or wheezing.). 10/21/18 01/04/19  McVey, Gelene Mink, PA-C    Family History Family History  Problem Relation Age of Onset   Healthy Mother    Hypertension Father    Diabetes Father     Social History Social History   Tobacco Use   Smoking status: Never Smoker   Smokeless tobacco: Never Used  Substance Use Topics   Alcohol use: Yes    Comment: rarely   Drug use: No     Allergies   Patient has no known allergies.   Review of Systems Review of Systems  Constitutional: Positive for activity change, appetite change, chills and fatigue. Negative for fever.  HENT: Negative.   Respiratory: Negative.   Cardiovascular: Negative.   Gastrointestinal: Positive for nausea. Negative for constipation, diarrhea and vomiting.  Genitourinary: Negative.   Musculoskeletal: Positive for myalgias.  Skin: Negative.   Neurological: Positive for weakness and headaches. Negative for dizziness, tremors and numbness.  Psychiatric/Behavioral: Positive for sleep disturbance. Negative for hallucinations and suicidal ideas.     Physical Exam Triage Vital Signs ED Triage Vitals  Enc Vitals Group     BP 01/17/19 1712 128/81     Pulse Rate 01/17/19 1712 89     Resp 01/17/19 1712 18     Temp 01/17/19 1712 98.2 F (36.8 C)     Temp Source 01/17/19 1712 Oral     SpO2 01/17/19 1712 97 %     Weight 01/17/19 1710 220 lb 0.3 oz (99.8  kg)     Height --      Head Circumference --      Peak Flow --      Pain Score 01/17/19 1709 0     Pain Loc --      Pain Edu? --      Excl. in [place]? --    No data found.  Updated Vital Signs BP 128/81 (BP Location: Left Arm)    Pulse 89    Temp 98.2 F (36.8 C) (Oral)    Resp 18    Wt 220 lb 0.3 oz (99.8 kg)    SpO2 97%    BMI 30.69 kg/m   Visual Acuity Right Eye Distance:   Left Eye Distance:   Bilateral Distance:    Right Eye Near:   Left Eye Near:    Bilateral Near:     Physical  Exam Vitals reviewed.  Constitutional:      General: He is not in acute distress.    Appearance: Normal appearance. He is not ill-appearing, toxic-appearing or diaphoretic.  Cardiovascular:     Rate and Rhythm: Normal rate and regular rhythm.  Pulmonary:     Effort: Pulmonary effort is normal.     Breath sounds: Normal breath sounds.  Musculoskeletal:        General: Normal range of motion.     Cervical back: Normal range of motion and neck supple. No rigidity or tenderness.  Lymphadenopathy:     Cervical: No cervical adenopathy.  Skin:    General: Skin is warm and dry.  Neurological:     General: No focal deficit present.     Mental Status: He is alert and oriented to person, place, and time.     Cranial Nerves: Cranial nerves are intact.     Sensory: Sensation is intact.     Motor: Motor function is intact.     Coordination: Coordination is intact.     Gait: Gait is intact.  Psychiatric:        Attention and Perception: Attention and perception normal.        Mood and Affect: Mood normal. Affect is flat.        Speech: Speech normal.        Behavior: Behavior normal.        Thought Content: Thought content normal.        Cognition and Memory: Cognition normal.        Judgment: Judgment normal.      UC Treatments / Results  Labs (all labs ordered are listed, but only abnormal results are displayed) Labs Reviewed  MONONUCLEOSIS SCREEN  HIV ANTIBODY (ROUTINE TESTING W REFLEX)  TSH    EKG   Radiology No results found.  Procedures Procedures (including critical care time)  Medications Ordered in UC Medications - No data to display  Initial Impression / Assessment and Plan / UC Course  I have reviewed the triage vital signs and the nursing notes.  Pertinent labs & imaging results that were available during my care of the patient were reviewed by me and considered in my medical decision making (see chart for details).    24 yo male presenting with fatigue,  change in appetite, general malaise, swollen glands, headaches, abdominal pain, chills, nausea and lack of interest in usual activities. COVID test on 01/15/19 was negative.  Patient is alert and oriented x3.  Afebrile.  Nontoxic-appearing.  Focal deficits noted.  Clear etiology of above complaints.  Discussed diagnosis with patient, lifestyle  modification and proper hydration.  We will check HIV, mono and TSH.  Patient advised to follow-up with PCP as soon as possible.  Patient agreeable.  Today's evaluation has revealed no signs of a dangerous process. Discussed diagnosis with patient and/or guardian. Patient and/or guardian aware of their diagnosis, possible red flag symptoms to watch out for and need for close follow up. Patient and/or guardian understands verbal and written discharge instructions. Patient and/or guardian comfortable with plan and disposition.  Patient and/or guardian has a clear mental status at this time, good insight into illness (after discussion and teaching) and has clear judgment to make decisions regarding their care  This care was provided during an unprecedented National Emergency due to the Novel Coronavirus (COVID-19) pandemic. COVID-19 infections and transmission risks place heavy strains on healthcare resources.  As this pandemic evolves, our facility, providers, and staff strive to respond fluidly, to remain operational, and to provide care relative to available resources and information. Outcomes are unpredictable and treatments are without well-defined guidelines. Further, the impact of COVID-19 on all aspects of urgent care, including the impact to patients seeking care for reasons other than COVID-19, is unavoidable during this national emergency. At this time of the global pandemic, management of patients has significantly changed, even for non-COVID positive patients given high local and regional COVID volumes at this time requiring high healthcare system and resource  utilization. The standard of care for management of both COVID suspected and non-COVID suspected patients continues to change rapidly at the local, regional, national, and global levels. This patient was worked up and treated to the best available but ever changing evidence and resources available at this current time.   Documentation was completed with the aid of voice recognition software. Transcription may contain typographical errors.    Final Clinical Impressions(s) / UC Diagnoses   Final diagnoses:  Fatigue, unspecified type     Discharge Instructions     We have done several tests today. You may go online to MyChart in a couple of days to review the results. Drink plenty of fluids to stay hydrated. Take medications as prescribed as needed for nausea. Follow-up with the community health center in [place]. Information below. Call ASAP to make an appointment.     ED Prescriptions    Medication Sig Dispense Auth. Provider   ondansetron (ZOFRAN ODT) 8 MG disintegrating tablet Take 1 tablet (8 mg total) by mouth every 8 (eight) hours as needed for nausea or vomiting. 20 tablet Enrique Sack, FNP     PDMP not reviewed this encounter.   Enrique Sack, [place] 01/17/19 1753

## 2019-01-17 NOTE — ED Triage Notes (Addendum)
Pt c/o adenopathy behind his ears, axilla and loss of appetite. Started about 5 days ago. He was seen for this on 01/15/19 and started on omeprazole and the abdominal pain he was having with these other sx resolved. covid test done on 01/15/19 negative. Denies fever.

## 2019-01-17 NOTE — Discharge Instructions (Addendum)
We have done several tests today. You may go online to MyChart in a couple of days to review the results. Drink plenty of fluids to stay hydrated. Take medications as prescribed as needed for nausea. Follow-up with the community health center in [place]. Information below. Call ASAP to make an appointment.

## 2019-01-18 ENCOUNTER — Emergency Department: Payer: BLUE CROSS/BLUE SHIELD

## 2019-01-18 ENCOUNTER — Encounter: Payer: Self-pay | Admitting: *Deleted

## 2019-01-18 ENCOUNTER — Emergency Department
Admission: EM | Admit: 2019-01-18 | Discharge: 2019-01-18 | Disposition: A | Discharge status: Home / Self Care | Payer: BLUE CROSS/BLUE SHIELD | Attending: Emergency Medicine | Admitting: Emergency Medicine

## 2019-01-18 ENCOUNTER — Other Ambulatory Visit: Payer: Self-pay

## 2019-01-18 DIAGNOSIS — R0989 Other specified symptoms and signs involving the circulatory and respiratory systems: Secondary | ICD-10-CM | POA: Diagnosis not present

## 2019-01-18 DIAGNOSIS — R5383 Other fatigue: Secondary | ICD-10-CM | POA: Diagnosis not present

## 2019-01-18 DIAGNOSIS — R110 Nausea: Secondary | ICD-10-CM | POA: Insufficient documentation

## 2019-01-18 DIAGNOSIS — R109 Unspecified abdominal pain: Secondary | ICD-10-CM | POA: Diagnosis present

## 2019-01-18 LAB — COMPREHENSIVE METABOLIC PANEL
ALT: 13 U/L (ref 0–44)
AST: 12 U/L — ABNORMAL LOW (ref 15–41)
Albumin: 4.6 g/dL (ref 3.5–5.0)
Alkaline Phosphatase: 64 U/L (ref 38–126)
Anion gap: 13 (ref 5–15)
BUN: 14 mg/dL (ref 6–20)
CO2: 23 mmol/L (ref 22–32)
Calcium: 9.3 mg/dL (ref 8.9–10.3)
Chloride: 99 mmol/L (ref 98–111)
Creatinine, Ser: 0.68 mg/dL (ref 0.61–1.24)
GFR calc Af Amer: >60 mL/min (ref >60)
GFR calc non Af Amer: >60 mL/min (ref >60)
Glucose, Bld: 86 mg/dL (ref 70–99)
Potassium: 4.1 mmol/L (ref 3.5–5.1)
Sodium: 135 mmol/L (ref 135–145)
Total Bilirubin: 2.2 mg/dL — ABNORMAL HIGH (ref 0.3–1.2)
Total Protein: 8.4 g/dL — ABNORMAL HIGH (ref 6.5–8.1)

## 2019-01-18 LAB — HIV ANTIBODY (ROUTINE TESTING W REFLEX): HIV Screen 4th Generation wRfx: NONREACTIVE

## 2019-01-18 LAB — URINALYSIS, COMPLETE (UACMP) WITH MICROSCOPIC
Bacteria, UA: NONE SEEN
Bilirubin Urine: NEGATIVE
Glucose, UA: NEGATIVE mg/dL
Hgb urine dipstick: NEGATIVE
Ketones, ur: 80 mg/dL — AB
Leukocytes,Ua: NEGATIVE
Nitrite: NEGATIVE
Protein, ur: NEGATIVE mg/dL
Specific Gravity, Urine: 1.018 (ref 1.005–1.030)
Squamous Epithelial / HPF: NONE SEEN (ref 0–5)
pH: 5 (ref 5.0–8.0)

## 2019-01-18 LAB — CBC
HCT: 45.1 % (ref 39.0–52.0)
Hemoglobin: 15.2 g/dL (ref 13.0–17.0)
MCH: 30.1 pg (ref 26.0–34.0)
MCHC: 33.7 g/dL (ref 30.0–36.0)
MCV: 89.3 fL (ref 80.0–100.0)
Platelets: 301 10*3/uL (ref 150–400)
RBC: 5.05 MIL/uL (ref 4.22–5.81)
RDW: 12.2 % (ref 11.5–15.5)
WBC: 13.9 10*3/uL — ABNORMAL HIGH (ref 4.0–10.5)
nRBC: 0 % (ref 0.0–0.2)

## 2019-01-18 LAB — LIPASE, BLOOD: Lipase: 22 U/L (ref 11–51)

## 2019-01-18 MED ORDER — IOHEXOL 300 MG/ML  SOLN
100.0000 mL | Freq: Once | INTRAMUSCULAR | Status: AC | PRN
  Administered 2019-01-18: 100 mL via INTRAVENOUS
  Filled 2019-01-18: qty 100

## 2019-01-18 MED ORDER — SODIUM CHLORIDE 0.9 % IV BOLUS
1000.0000 mL | Freq: Once | INTRAVENOUS | Status: AC
  Administered 2019-01-18: 1000 mL via INTRAVENOUS

## 2019-01-18 NOTE — Discharge Instructions (Signed)
You should follow-up with your primary care doctor or I also gave you at the number for a oncologist.  You can call their office and see if you could be seen by them faster.  Your labs showed a slightly elevated white count and your total bilirubin was slightly elevated but your CT scan was negative.  You should follow-up outpatient to have this lymph node evaluated to see if it meets requirements for biopsy. Return to the ER for any other concerns.

## 2019-01-18 NOTE — ED Provider Notes (Signed)
[facility] Emergency Department Provider Note  ____________________________________________   First MD Initiated Contact with Patient 01/18/19 1902     (approximate)  I have reviewed the triage vital signs and the nursing notes.   HISTORY  Chief Complaint Abdominal Pain and Fatigue    HPI Richard Love is a 24 y.o. male who comes in for abdominal pain.  Patient states that he has had intermittent abdominal pain for the past 2 to 3 weeks but is been worsening over the past few days.  The pain is severe, constant, nothing makes it better, worse with trying to eat.  The pain is mostly on the left side of his abdomen.  He states that he is feeling nauseous although not right now making it difficult for him to eat.  He reports continued fatigue as well.  He has a lymph node on the back of his posterior neck as well been there 3-4 wks?.  Patient was first seen on 12/22 and had an x-ray abd that was negative.  He had followed up with urgent care in January and had a negative Covid test.  He then went into a negative urgent care yesterday and his HIV, mono and TSH test were normal          No past medical history on file.  There are no problems to display for this patient.   Past Surgical History:  Procedure Laterality Date  . NO PAST SURGERIES      Prior to Admission medications   Medication Sig Start Date End Date Taking? Authorizing Provider  omeprazole (PRILOSEC) 40 MG capsule Take 1 capsule (40 mg total) by mouth daily. 01/15/19   Coral Spikes, DO  ondansetron (ZOFRAN ODT) 8 MG disintegrating tablet Take 1 tablet (8 mg total) by mouth every 8 (eight) hours as needed for nausea or vomiting. 01/17/19   Enrique Sack, FNP  albuterol (VENTOLIN HFA) 108 (90 Base) MCG/ACT inhaler Inhale 2 puffs into the lungs every 4 (four) hours as needed for wheezing or shortness of breath (cough, shortness of breath or wheezing.). 10/21/18 01/04/19  McVey, Gelene Mink, PA-C    Allergies Patient has no known allergies.  Family History  Problem Relation Age of Onset  . Healthy Mother   . Hypertension Father   . Diabetes Father     Social History Social History   Tobacco Use  . Smoking status: Never Smoker  . Smokeless tobacco: Never Used  Substance Use Topics  . Alcohol use: Not Currently    Comment: rarely  . Drug use: No      Review of Systems Constitutional: No fever/chills, + fatigue  Eyes: No visual changes. ENT: No sore throat. + lymph node  Cardiovascular: Denies chest pain. Respiratory: Denies shortness of breath. Gastrointestinal: Positive abdominal pain with intermittent nausea..  No diarrhea.  No constipation. Genitourinary: Negative for dysuria. Musculoskeletal: Negative for back pain. Skin: Negative for rash. Neurological: Negative for headaches, focal weakness or numbness. All other ROS negative ____________________________________________   PHYSICAL EXAM:  VITAL SIGNS: ED Triage Vitals  Enc Vitals Group     BP 01/18/19 1728 125/75     Pulse Rate 01/18/19 1728 98     Resp 01/18/19 1728 18     Temp 01/18/19 1728 99.1 F (37.3 C)     Temp Source 01/18/19 1728 Oral     SpO2 01/18/19 1728 98 %     Weight 01/18/19 1725 215 lb (97.5 kg)  Height 01/18/19 1725 5\' 11"  (1.803 m)     Head Circumference --      Peak Flow --      Pain Score 01/18/19 1725 6     Pain Loc --      Pain Edu? --      Excl. in [place]? --     Constitutional: Alert and oriented. Well appearing and in no acute distress. Eyes: Conjunctivae are normal. EOMI. Head: Atraumatic. Nose: No congestion/rhinnorhea. Mouth/Throat: Mucous membranes are moist.   Neck: No stridor. Trachea Midline. FROM 1cm mobile lymph node on the right posterior cervical region. No supraclavicular, axillary, groin nodes.  Cardiovascular: Normal rate, regular rhythm. Grossly normal heart sounds.  Good peripheral circulation. Respiratory: Normal respiratory  effort.  No retractions. Lungs CTAB. Gastrointestinal: Tender in the left side of the abdomen. No distention. No abdominal bruits.  Musculoskeletal: No lower extremity tenderness nor edema.  No joint effusions. Neurologic:  Normal speech and language. No gross focal neurologic deficits are appreciated.  Skin:  Skin is warm, dry and intact. No rash noted. Psychiatric: Mood and affect are normal. Speech and behavior are normal. GU: Deferred   ____________________________________________   LABS (all labs ordered are listed, but only abnormal results are displayed)  Labs Reviewed  COMPREHENSIVE METABOLIC PANEL - Abnormal; Notable for the following components:      Result Value   Total Protein 8.4 (*)    AST 12 (*)    Total Bilirubin 2.2 (*)    All other components within normal limits  CBC - Abnormal; Notable for the following components:   WBC 13.9 (*)    All other components within normal limits  URINALYSIS, COMPLETE (UACMP) WITH MICROSCOPIC - Abnormal; Notable for the following components:   Color, Urine YELLOW (*)    APPearance CLEAR (*)    Ketones, ur 80 (*)    All other components within normal limits  LIPASE, BLOOD   ____________________________________________  RADIOLOGY   Official radiology report(s): CT ABDOMEN PELVIS W CONTRAST  Result Date: 01/18/2019 CLINICAL DATA:  Abdominal pain and fatigue, decreased appetite EXAM: CT ABDOMEN AND PELVIS WITH CONTRAST TECHNIQUE: Multidetector CT imaging of the abdomen and pelvis was performed using the standard protocol following bolus administration of intravenous contrast. CONTRAST:  160mL OMNIPAQUE IOHEXOL 300 MG/ML  SOLN COMPARISON:  None. FINDINGS: Lower chest: No acute abnormality. Hepatobiliary: No focal liver abnormality is seen. No gallstones, gallbladder wall thickening, or biliary dilatation. Pancreas: Unremarkable Spleen: Unremarkable. Adrenals/Urinary Tract: Adrenal glands are unremarkable. Kidneys are normal, without  renal calculi, focal lesion, or hydronephrosis. Bladder is unremarkable. Stomach/Bowel: Stomach is within normal limits. Appendix appears normal. No evidence of bowel wall thickening, distention, or inflammatory changes. Vascular/Lymphatic: No significant vascular findings are present. No enlarged abdominal or pelvic lymph nodes. Reproductive: Prostate is unremarkable. Other: No ascites or hernia. Musculoskeletal: Unremarkable. IMPRESSION: No findings to account for reported symptoms or other acute abnormality. Electronically Signed   By: Macy Mis M.D.   On: 01/18/2019 19:38    ____________________________________________   PROCEDURES  Procedure(s) performed (including Critical Care):  Procedures   ____________________________________________   INITIAL IMPRESSION / ASSESSMENT AND PLAN / ED COURSE  Richard Love was evaluated in Emergency Department on 01/18/2019 for the symptoms described in the history of present illness. He was evaluated in the context of the global COVID-19 pandemic, which necessitated consideration that the patient might be at risk for infection with the SARS-CoV-2 virus that causes COVID-19. Institutional protocols and algorithms that pertain to  the evaluation of patients at risk for COVID-19 are in a state of rapid change based on information released by regulatory bodies including the CDC and federal and state organizations. These policies and algorithms were followed during the patient's care in the ED.    Patient is a well-appearing 24 year old who comes in with fatigue and abdominal pain.  Had a lengthy discussion with patient and we elected to do a CT scan to evaluate for mass, additional lymph nodes, diverticulitis, evidence of colitis.  No right upper quadrant pain to suggest cholecystitis.  Patient does have a lymph node on the right posterior neck.  No supraclavicular lymph nodes.  He does not have any shortness of breath, cough to suggest TB or other lung  process.  He is already been tested for HIV and mono.  He has had it for 3 to 4 weeks so he denies any cat scratch. He denies any SI. Patient is jsut concerned about lymphoma.  I discussed with patient that given it has been there for this long he may now meet requirement for biopsy says this is typically done if his pain persisted for greater than 4 weeks.  I discussed with patient following up with his primary care doctor to discuss biopsy.  Patient says he has an appointment for later this month.  Labs are reassuring although total bili is slightly elevated 2.2 although patient has no right upper quadrant tenderness. D/w pt and will f/u for recheck with them.  CBC does show an elevated white count at 13.9.  UA without evidence of UTI although does have some ketones in his urine.  But his glucose is normal so unlikely new onset diabetes.  CT abdomen was negative.  Reevaluated patient he continues to do well.  Patient understands that he needs to follow-up for consideration of biopsy after discussing with his primary care doctor.  Also given oncology number if he can get in earlier there.  Patient understands he can return to the ER if his symptoms are worsening or he has any other concerns   ____________________________________________   FINAL CLINICAL IMPRESSION(S) / ED DIAGNOSES   Final diagnoses:  Other fatigue  Firm lymph node      MEDICATIONS GIVEN DURING THIS VISIT:  Medications  sodium chloride 0.9 % bolus 1,000 mL (1,000 mLs Intravenous New Bag/Given 01/18/19 1916)  iohexol (OMNIPAQUE) 300 MG/ML solution 100 mL (100 mLs Intravenous Contrast Given 01/18/19 1921)     ED Discharge Orders    None       Note:  This document was prepared using Dragon voice recognition software and may include unintentional dictation errors.   Vanessa Maunawili, MD 01/18/19 2037

## 2019-01-18 NOTE — ED Triage Notes (Signed)
Pt ambulatory to triage.  Pt has abd pain and fatigue for 2 weeks.  Pt also reports decrease in appetite.  [facility] sx.

## 2019-04-18 ENCOUNTER — Ambulatory Visit: Payer: BLUE CROSS/BLUE SHIELD

## 2020-01-23 DIAGNOSIS — U071 COVID-19: Secondary | ICD-10-CM | POA: Insufficient documentation

## 2020-02-08 ENCOUNTER — Telehealth: Payer: Medicaid Other | Admitting: Nurse Practitioner

## 2020-02-08 DIAGNOSIS — R21 Rash and other nonspecific skin eruption: Secondary | ICD-10-CM

## 2020-02-08 MED ORDER — PREDNISONE 20 MG PO TABS: 20.0000 mg | ORAL_TABLET | Freq: Every day | ORAL | 0 refills | Status: AC

## 2020-02-08 MED ORDER — TRIAMCINOLONE ACETONIDE 0.1 % EX CREA: 1.0000 "application " | TOPICAL_CREAM | Freq: Two times a day (BID) | CUTANEOUS | 0 refills | Status: DC | PRN

## 2020-02-08 NOTE — Progress Notes (Signed)
Virtual Visit Progress Note  Richard Love are scheduled for a virtual visit with your provider today.    Just as we do with appointments in the office, we must obtain your consent to participate.  Your consent will be active for this visit and any virtual visit you may have with one of our providers in the next 365 days.    If you have a MyChart account, I can also send a copy of this consent to you electronically.  All virtual visits are billed to your insurance company just like a traditional visit in the office.  As this is a virtual visit, video technology does not allow for your provider to perform a traditional examination.  This may limit your provider's ability to fully assess your condition.  If your provider identifies any concerns that need to be evaluated in person or the need to arrange testing such as labs, EKG, etc, we will make arrangements to do so.    Although advances in technology are sophisticated, we cannot ensure that it will always work on either your end or our end.  If the connection with a video visit is poor, we may have to switch to a telephone visit.  With either a video or telephone visit, we are not always able to ensure that we have a secure connection.   I need to obtain your verbal consent now.   Are you willing to proceed with your visit today?   Richard Love has provided verbal consent on 02/08/2020 for a virtual visit (video or telephone).   Verlon Au, NP 02/08/2020  4:50 PM    I connected with Richard Love on 02/08/20 at  5:00 PM EST by video enabled telemedicine visit and verified that I am speaking with the correct person using two identifiers.   I discussed the limitations, risks, security and privacy concerns of performing an evaluation and management service by telemedicine and the availability of in-person appointments. I also discussed with the patient that there may be a patient responsible charge related to this service. The patient  expressed understanding and agreed to proceed.   Other persons participating in the visit and their role in the encounter: none  Patient's location: home Provider's location: home  Chief Complaint: skin rash    Patient Care Team: [facility] as PCP - General (General Practice)   Name of the patient: Richard Love  [id number]  Sep 18, 1995   Date of visit: 02/08/20  History of Presenting Illness- Patient is 25 year old male who presents to clinic for complaints of skin rash on face and arms. Describes as itchy. Started past couple of days and has gradually spread. No new topicals or medications. Skin feels dry. Has tried applying lotion without improvement. Hasn't had symptoms like this before. Hasn't sought previous evaluation for symptoms. No openings or wounds. Had covid previously and questions if this is related.   Review of systems- Review of Systems  Constitutional: Positive for malaise/fatigue. Negative for chills, diaphoresis, fever and weight loss.  HENT: Negative for congestion, ear discharge, ear pain, nosebleeds, sinus pain and sore throat.   Eyes: Negative for pain and redness.  Respiratory: Negative for cough, hemoptysis, sputum production, shortness of breath, wheezing and stridor.   Cardiovascular: Negative for chest pain, palpitations and leg swelling.  Gastrointestinal: Negative for abdominal pain, constipation, diarrhea, nausea and vomiting.  Musculoskeletal: Negative for falls, joint pain and myalgias.  Skin: Positive for itching and rash.  Neurological: Negative for dizziness, loss of consciousness, weakness and headaches.  Psychiatric/Behavioral: Negative for depression. The patient is not nervous/anxious and does not have insomnia.   All other systems reviewed and are negative.    No Known Allergies  No past medical history on file.  Past Surgical History:  Procedure Laterality Date  . NO PAST SURGERIES      Social History    Socioeconomic History  . Marital status: Single    Spouse name: Not on file  . Number of children: Not on file  . Years of education: Not on file  . Highest education level: Not on file  Occupational History  . Not on file  Tobacco Use  . Smoking status: Never Smoker  . Smokeless tobacco: Never Used  Vaping Use  . Vaping Use: Former  . Quit date: 11/04/2018  Substance and Sexual Activity  . Alcohol use: Not Currently    Comment: rarely  . Drug use: No  . Sexual activity: Not on file  Other Topics Concern  . Not on file  Social History Narrative  . Not on file   Social Determinants of Health   Financial Resource Strain: Not on file  Food Insecurity: Not on file  Transportation Needs: Not on file  Physical Activity: Not on file  Stress: Not on file  Social Connections: Not on file  Intimate Partner Violence: Not on file    There is no immunization history on file for this patient.  Family History  Problem Relation Age of Onset  . Healthy Mother   . Hypertension Father   . Diabetes Father     Current Outpatient Medications:  .  omeprazole (PRILOSEC) 40 MG capsule, Take 1 capsule (40 mg total) by mouth daily., Disp: 30 capsule, Rfl: 0 .  ondansetron (ZOFRAN ODT) 8 MG disintegrating tablet, Take 1 tablet (8 mg total) by mouth every 8 (eight) hours as needed for nausea or vomiting., Disp: 20 tablet, Rfl: 0  Physical exam: Exam limited due to telemedicine Physical Exam Skin:    General: Skin is dry.     Coloration: Skin is not pale.     Findings: Rash (scattered papular erythematous lesions with skin flaking across face and arms) present.  Neurological:     Mental Status: He is alert and oriented to person, place, and time.  Psychiatric:        Mood and Affect: Mood normal.        Behavior: Behavior normal.      Assessment and plan- Patient is a 25 y.o. male who presents for rash. Etiology unclear but recommend starting prednisone 20 mg daily. Use gentle  topical for dryness such as aquaphor. Will send triamcinolone for arms. Advised not to use on face due to skin lightening and thinning. Can use otc hydrocortisone once a day, not to exceed 3 days. Unclear if post inflammatory skin reaction to covid 19. Certainly possible and if so, may need extended course of prednisone and slow taper. Could also be topical dermatitis.   If symptoms worsen, or do not improve, recommend in person evaluation with pcp.    Visit Diagnosis 1. Rash    Patient expressed understanding and was in agreement with this plan. He also understands that He can call clinic at any time with any questions, concerns, or complaints.   I discussed the assessment and treatment plan with the patient. The patient was provided an opportunity to ask questions and all were answered. The patient agreed with the  plan and demonstrated an understanding of the instructions.   The patient was advised to call back or seek an in-person evaluation if the symptoms worsen or if the condition fails to improve as anticipated.   I provided 15 minutes of face-to-face video visit time during this encounter, and > 50% was spent counseling as documented under my assessment & plan.  Thank you for allowing me to participate in the care of this very pleasant patient.   Beckey Rutter, DNP, AGNP-C [facility]

## 2020-02-13 ENCOUNTER — Encounter: Payer: Self-pay | Admitting: Nurse Practitioner

## 2020-02-13 NOTE — Patient Instructions (Signed)
Rash, Adult  A rash is a change in the color of your skin. A rash can also change the way your skin feels. There are many different conditions and factors that can cause a rash. Follow these instructions at home: The goal of treatment is to stop the itching and keep the rash from spreading. Watch for any changes in your symptoms. Let your doctor know about them. Follow these instructions to help with your condition: Medicine Take or apply over-the-counter and prescription medicines only as told by your doctor. These may include medicines:  To treat red or swollen skin (corticosteroid creams).  To treat itching.  To treat an allergy (oral antihistamines).  To treat very bad symptoms (oral corticosteroids).   Skin care  Put cool cloths (compresses) on the affected areas.  Do not scratch or rub your skin.  Avoid covering the rash. Make sure that the rash is exposed to air as much as possible. Managing itching and discomfort  Avoid hot showers or baths. These can make itching worse. A cold shower may help.  Try taking a bath with: ? Epsom salts. You can get these at your local pharmacy or grocery store. Follow the instructions on the package. ? Baking soda. Pour a small amount into the bath as told by your doctor. ? Colloidal oatmeal. You can get this at your local pharmacy or grocery store. Follow the instructions on the package.  Try putting baking soda paste onto your skin. Stir water into baking soda until it gets like a paste.  Try putting on a lotion that relieves itchiness (calamine lotion).  Keep cool and out of the sun. Sweating and being hot can make itching worse. General instructions  Rest as needed.  Drink enough fluid to keep your pee (urine) pale yellow.  Wear loose-fitting clothing.  Avoid scented soaps, detergents, and perfumes. Use gentle soaps, detergents, perfumes, and other cosmetic products.  Avoid anything that causes your rash. Keep a journal to help  track what causes your rash. Write down: ? What you eat. ? What cosmetic products you use. ? What you drink. ? What you wear. This includes jewelry.  Keep all follow-up visits as told by your doctor. This is important.   Contact a doctor if:  You sweat at night.  You lose weight.  You pee (urinate) more than normal.  You pee less than normal, or you notice that your pee is a darker color than normal.  You feel weak.  You throw up (vomit).  Your skin or the whites of your eyes look yellow (jaundice).  Your skin: ? Tingles. ? Is numb.  Your rash: ? Does not go away after a few days. ? Gets worse.  You are: ? More thirsty than normal. ? More tired than normal.  You have: ? New symptoms. ? Pain in your belly (abdomen). ? A fever. ? Watery poop (diarrhea). Get help right away if:  You have a fever and your symptoms suddenly get worse.  You start to feel mixed up (confused).  You have a very bad headache or a stiff neck.  You have very bad joint pains or stiffness.  You have jerky movements that you cannot control (seizure).  Your rash covers all or most of your body. The rash may or may not be painful.  You have blisters that: ? Are on top of the rash. ? Grow larger. ? Grow together. ? Are painful. ? Are inside your nose or mouth.  You have   a rash that: ? Looks like purple pinprick-sized spots all over your body. ? Has a "bull's eye" or looks like a target. ? Is red and painful, causes your skin to peel, and is not from being in the sun too long. Summary  A rash is a change in the color of your skin. A rash can also change the way your skin feels.  The goal of treatment is to stop the itching and keep the rash from spreading.  Take or apply over-the-counter and prescription medicines only as told by your doctor.  Contact a doctor if you have new symptoms or symptoms that get worse.  Keep all follow-up visits as told by your doctor. This is  important. This information is not intended to replace advice given to you by your health care provider. Make sure you discuss any questions you have with your health care provider. Document Revised: 04/23/2018 Document Reviewed: 08/03/2017 Elsevier Patient Education  2021 [employer].  

## 2020-04-02 ENCOUNTER — Other Ambulatory Visit: Payer: Self-pay | Admitting: Family Medicine

## 2020-05-01 ENCOUNTER — Ambulatory Visit: Payer: Medicaid Other | Admitting: Internal Medicine

## 2020-08-09 ENCOUNTER — Ambulatory Visit
Admission: EM | Admit: 2020-08-09 | Discharge: 2020-08-09 | Disposition: A | Discharge status: Home / Self Care | Payer: Self-pay | Attending: Sports Medicine | Admitting: Sports Medicine

## 2020-08-09 ENCOUNTER — Other Ambulatory Visit: Payer: Self-pay

## 2020-08-09 ENCOUNTER — Encounter: Payer: Self-pay | Admitting: Emergency Medicine

## 2020-08-09 DIAGNOSIS — L84 Corns and callosities: Secondary | ICD-10-CM

## 2020-08-09 DIAGNOSIS — M79671 Pain in right foot: Secondary | ICD-10-CM

## 2020-08-09 MED ORDER — CEPHALEXIN 500 MG PO CAPS: 500.0000 mg | ORAL_CAPSULE | Freq: Three times a day (TID) | ORAL | 0 refills | Status: DC

## 2020-08-09 NOTE — Discharge Instructions (Addendum)
As we discussed, I gave you an antibiotic just to make sure that you are not developing an infection.  The wound actually looks fine.  You have developed a callus with a little bit of a fissure and ulceration.  You tell me that this is chronic.  There is no redness or warmth or active abscess formation. I recommend you go to the drugstore and purchase some corn pads.  Put it around that area to take the pressure off that area and hopefully that helps with your pain. I gave you the name of 2 foot doctors locally the you can call and see if there is anything they can do in terms of paring that down and helping you with your current symptoms.

## 2020-08-09 NOTE — ED Triage Notes (Signed)
Patient reports he stepped on a tooth pick several years ago.  Patient reports recently right foot started hurting.  Patient felt a little bump on sole of right foot.  Patient denies manipulating site of old injury

## 2020-08-09 NOTE — ED Provider Notes (Signed)
[facility]    CSN: [id number] Arrival date & time: 08/09/20  1740      History   Chief Complaint Chief Complaint  Patient presents with   Foot Pain    HPI Richard Love is a 25 y.o. male.   25 year old male who presents for evaluation of pain on the plantar surface of his right foot.  On further history it appears as though he stepped on a toothpick when he was about 25 years old so on proximately 15 years ago.  He has vague history of being seen by a physician and possibly getting some medication.  Since then he has always had a callus formation and area on his foot that was always a little hypersensitive.  He started a new job about 6 weeks ago where he is walking around he is on his feet much more.  His foot has started to bother him and is progressed to the point where he is coming in tonight for initial evaluation.  He does not have a primary care physician.  He has not seen podiatry in the past.  He denies any fever shakes chills.  No nausea vomiting diarrhea.  No other symptoms offered by the patient.  No red flag signs or symptoms elicited on history.   History reviewed. No pertinent past medical history.  Patient Active Problem List   Diagnosis Date Noted   COVID-19 01/23/2020    Past Surgical History:  Procedure Laterality Date   NO PAST SURGERIES         Home Medications    Prior to Admission medications   Medication Sig Start Date End Date Taking? Authorizing Provider  cephALEXin (KEFLEX) 500 MG capsule Take 1 capsule (500 mg total) by mouth 3 (three) times daily. 08/09/20  Yes Verda Cumins, MD  omeprazole (PRILOSEC) 40 MG capsule Take 1 capsule (40 mg total) by mouth daily. Patient not taking: Reported on 08/09/2020 01/15/19   Coral Spikes, DO  ondansetron (ZOFRAN ODT) 8 MG disintegrating tablet Take 1 tablet (8 mg total) by mouth every 8 (eight) hours as needed for nausea or vomiting. Patient not taking: Reported on 08/09/2020 01/17/19    Enrique Sack, FNP  triamcinolone (KENALOG) 0.1 % Apply 1 application topically 2 (two) times daily as needed. Patient not taking: Reported on 08/09/2020 02/08/20   Verlon Au, NP  albuterol (VENTOLIN HFA) 108 (90 Base) MCG/ACT inhaler Inhale 2 puffs into the lungs every 4 (four) hours as needed for wheezing or shortness of breath (cough, shortness of breath or wheezing.). 10/21/18 01/04/19  McVey, Gelene Mink, PA-C    Family History Family History  Problem Relation Age of Onset   Healthy Mother    Hypertension Father    Diabetes Father     Social History Social History   Tobacco Use   Smoking status: Never   Smokeless tobacco: Never  Vaping Use   Vaping Use: Former   Quit date: 11/04/2018  Substance Use Topics   Alcohol use: Not Currently    Comment: rarely   Drug use: No     Allergies   Patient has no known allergies.   Review of Systems Review of Systems  Constitutional:  Positive for activity change. Negative for appetite change, chills, diaphoresis, fatigue and fever.  HENT:  Negative for congestion, ear pain, postnasal drip, rhinorrhea, sinus pressure, sinus pain, sneezing and sore throat.   Eyes:  Negative for pain.  Respiratory:  Negative for cough, chest tightness and shortness  of breath.   Cardiovascular:  Negative for chest pain and palpitations.  Gastrointestinal:  Negative for abdominal pain, diarrhea, nausea and vomiting.  Genitourinary:  Negative for dysuria.  Musculoskeletal:  Positive for arthralgias. Negative for back pain, myalgias and neck pain.  Skin:  Positive for wound. Negative for color change, pallor and rash.  Neurological:  Negative for dizziness, light-headedness, numbness and headaches.  All other systems reviewed and are negative.   Physical Exam Triage Vital Signs ED Triage Vitals  Enc Vitals Group     BP 08/09/20 1750 134/85     Pulse Rate 08/09/20 1750 92     Resp 08/09/20 1750 18     Temp 08/09/20 1750 98.9 F  (37.2 C)     Temp Source 08/09/20 1750 Oral     SpO2 08/09/20 1750 100 %     Weight --      Height --      Head Circumference --      Peak Flow --      Pain Score 08/09/20 1748 6     Pain Loc --      Pain Edu? --      Excl. in [place]? --    No data found.  Updated Vital Signs BP 134/85 (BP Location: Left Arm) Comment (BP Location): large cuff  Pulse 92   Temp 98.9 F (37.2 C) (Oral)   Resp 18   SpO2 100%   Visual Acuity Right Eye Distance:   Left Eye Distance:   Bilateral Distance:    Right Eye Near:   Left Eye Near:    Bilateral Near:     Physical Exam Vitals and nursing note reviewed.  Constitutional:      General: He is not in acute distress.    Appearance: Normal appearance. He is not ill-appearing, toxic-appearing or diaphoretic.  HENT:     Head: Normocephalic and atraumatic.     Nose: Nose normal.     Mouth/Throat:     Mouth: Mucous membranes are moist.  Eyes:     Conjunctiva/sclera: Conjunctivae normal.     Pupils: Pupils are equal, round, and reactive to light.  Cardiovascular:     Rate and Rhythm: Normal rate and regular rhythm.     Pulses: Normal pulses.     Heart sounds: Normal heart sounds. No murmur heard.   No friction rub. No gallop.  Pulmonary:     Effort: Pulmonary effort is normal.     Breath sounds: Normal breath sounds. No stridor. No wheezing, rhonchi or rales.  Musculoskeletal:     Cervical back: Normal range of motion and neck supple.  Skin:    General: Skin is warm and dry.     Capillary Refill: Capillary refill takes less than 2 seconds.     Coloration: Skin is jaundiced.     Findings: No erythema or rash.     Comments: Patient has a callus type formation on the plantar surface of the right foot with a little bit of an ulceration and crevasse noted.  There is no drainage or significant erythema.  Is tender to palpation.  No pus can be expressed.  Otherwise exam is within normal limits.  Neurological:     General: No focal deficit  present.     Mental Status: He is alert and oriented to person, place, and time.     UC Treatments / Results  Labs (all labs ordered are listed, but only abnormal results are displayed) Labs Reviewed - No  data to display  EKG   Radiology No results found.  Procedures Procedures (including critical care time)  Medications Ordered in UC Medications - No data to display  Initial Impression / Assessment and Plan / UC Course  I have reviewed the triage vital signs and the nursing notes.  Pertinent labs & imaging results that were available during my care of the patient were reviewed by me and considered in my medical decision making (see chart for details).  Clinical impression: 1.  Chronic right foot pain 2.  Recent increase in activity 3.  Chronic callus with a fissure.  Plan: 1.  The findings and treatment plan were discussed in detail the patient.  Patient was in agreement. 2.  We did have a long discussion and I told him that I did not feel it was infected but given his pain I am going to go ahead and cover him with Keflex 3 times a day for a week. 3.  Encouraged him to go to the drugstore and purchase some corn pads to try to take some of the pressure off that area. 4.  Gave him the names and phone numbers of 2 local podiatry groups encouraged him to get into get some treatments on this and hopefully he will be able to take care of his symptoms. 5.  Educational handouts provided. 6.  Supportive care, over-the-counter meds as needed. 7.  He was discharged in stable condition will follow-up here as needed.    Final Clinical Impressions(s) / UC Diagnoses   Final diagnoses:  Foot pain, right  Callus of foot     Discharge Instructions      As we discussed, I gave you an antibiotic just to make sure that you are not developing an infection.  The wound actually looks fine.  You have developed a callus with a little bit of a fissure and ulceration.  You tell me that this  is chronic.  There is no redness or warmth or active abscess formation. I recommend you go to the drugstore and purchase some corn pads.  Put it around that area to take the pressure off that area and hopefully that helps with your pain. I gave you the name of 2 foot doctors locally the you can call and see if there is anything they can do in terms of paring that down and helping you with your current symptoms.     ED Prescriptions     Medication Sig Dispense Auth. Provider   cephALEXin (KEFLEX) 500 MG capsule Take 1 capsule (500 mg total) by mouth 3 (three) times daily. 21 capsule Verda Cumins, MD      PDMP not reviewed this encounter.   Verda Cumins, MD 08/09/20 1950

## 2020-10-02 ENCOUNTER — Ambulatory Visit: Payer: BC Managed Care – PPO | Admitting: Podiatry

## 2020-10-27 ENCOUNTER — Emergency Department (INDEPENDENT_AMBULATORY_CARE_PROVIDER_SITE_OTHER)
Admission: EM | Admit: 2020-10-27 | Discharge: 2020-10-27 | Disposition: A | Discharge status: Home / Self Care | Payer: BC Managed Care – PPO | Source: Home / Self Care

## 2020-10-27 ENCOUNTER — Telehealth: Payer: Medicaid Other | Admitting: Emergency Medicine

## 2020-10-27 ENCOUNTER — Other Ambulatory Visit: Payer: Self-pay

## 2020-10-27 ENCOUNTER — Encounter: Payer: Self-pay | Admitting: Nurse Practitioner

## 2020-10-27 ENCOUNTER — Ambulatory Visit: Admit: 2020-10-27 | Payer: Medicaid Other

## 2020-10-27 ENCOUNTER — Encounter: Payer: Self-pay | Admitting: Emergency Medicine

## 2020-10-27 ENCOUNTER — Other Ambulatory Visit (HOSPITAL_COMMUNITY)
Admission: RE | Admit: 2020-10-27 | Discharge: 2020-10-27 | Disposition: A | Discharge status: Home / Self Care | Payer: BC Managed Care – PPO | Source: Ambulatory Visit | Attending: Family Medicine | Admitting: Family Medicine

## 2020-10-27 DIAGNOSIS — R300 Dysuria: Secondary | ICD-10-CM | POA: Diagnosis present

## 2020-10-27 LAB — POCT URINALYSIS DIP (MANUAL ENTRY)
Bilirubin, UA: NEGATIVE
Blood, UA: NEGATIVE
Glucose, UA: NEGATIVE mg/dL
Ketones, POC UA: NEGATIVE mg/dL
Leukocytes, UA: NEGATIVE
Nitrite, UA: NEGATIVE
Protein Ur, POC: NEGATIVE mg/dL
Spec Grav, UA: 1.02 (ref 1.010–1.025)
Urobilinogen, UA: 0.2 E.U./dL
pH, UA: 8.5 — AB (ref 5.0–8.0)

## 2020-10-27 MED ORDER — SULFAMETHOXAZOLE-TRIMETHOPRIM 800-160 MG PO TABS: 1.0000 | ORAL_TABLET | Freq: Two times a day (BID) | ORAL | 0 refills | Status: AC

## 2020-10-27 NOTE — ED Triage Notes (Signed)
Patient c/o dysuria and a fluid discharge since Thursday.  Partner was recently treated for GC.

## 2020-10-27 NOTE — ED Provider Notes (Signed)
[facility]    CSN: [id number] Arrival date & time: 10/27/20  1225      History   Chief Complaint Chief Complaint  Patient presents with   Dysuria    HPI Richard Love is a 25 y.o. male.   HPI 25 year old male presents with dysuria and scant fluid discharge for 2 days.    History reviewed. No pertinent past medical history.  Patient Active Problem List   Diagnosis Date Noted   COVID-19 01/23/2020    Past Surgical History:  Procedure Laterality Date   NO PAST SURGERIES         Home Medications    Prior to Admission medications   Medication Sig Start Date End Date Taking? Authorizing Provider  sulfamethoxazole-trimethoprim (BACTRIM DS) 800-160 MG tablet Take 1 tablet by mouth 2 (two) times daily for 3 days. 10/27/20 10/30/20 Yes Eliezer Lofts, FNP  cephALEXin (KEFLEX) 500 MG capsule Take 1 capsule (500 mg total) by mouth 3 (three) times daily. 08/09/20   Verda Cumins, MD  omeprazole (PRILOSEC) 40 MG capsule Take 1 capsule (40 mg total) by mouth daily. Patient not taking: Reported on 08/09/2020 01/15/19   Coral Spikes, DO  ondansetron (ZOFRAN ODT) 8 MG disintegrating tablet Take 1 tablet (8 mg total) by mouth every 8 (eight) hours as needed for nausea or vomiting. Patient not taking: Reported on 08/09/2020 01/17/19   Enrique Sack, FNP  triamcinolone (KENALOG) 0.1 % Apply 1 application topically 2 (two) times daily as needed. Patient not taking: Reported on 08/09/2020 02/08/20   Verlon Au, NP  albuterol (VENTOLIN HFA) 108 (90 Base) MCG/ACT inhaler Inhale 2 puffs into the lungs every 4 (four) hours as needed for wheezing or shortness of breath (cough, shortness of breath or wheezing.). 10/21/18 01/04/19  McVey, Gelene Mink, PA-C    Family History Family History  Problem Relation Age of Onset   Healthy Mother    Hypertension Father    Diabetes Father     Social History Social History   Tobacco Use   Smoking status: Never    Smokeless tobacco: Never  Vaping Use   Vaping Use: Former   Quit date: 11/04/2018  Substance Use Topics   Alcohol use: Not Currently    Comment: rarely   Drug use: No     Allergies   Patient has no known allergies.   Review of Systems Review of Systems  Genitourinary:  Positive for dysuria.  All other systems reviewed and are negative.   Physical Exam Triage Vital Signs ED Triage Vitals  Enc Vitals Group     BP 10/27/20 1237 135/85     Pulse Rate 10/27/20 1237 96     Resp --      Temp 10/27/20 1237 98.5 F (36.9 C)     Temp Source 10/27/20 1237 Oral     SpO2 10/27/20 1237 97 %     Weight 10/27/20 1238 222 lb (100.7 kg)     Height 10/27/20 1238 5\' 11"  (1.803 m)     Head Circumference --      Peak Flow --      Pain Score 10/27/20 1238 0     Pain Loc --      Pain Edu? --      Excl. in [place]? --    No data found.  Updated Vital Signs BP 135/85 (BP Location: Right Arm)   Pulse 96   Temp 98.5 F (36.9 C) (Oral)   Ht 5\' 11"  (  1.803 m)   Wt 222 lb (100.7 kg)   SpO2 97%   BMI 30.96 kg/m       Physical Exam Vitals and nursing note reviewed.  Constitutional:      General: He is not in acute distress.    Appearance: Normal appearance. He is not ill-appearing.  HENT:     Head: Normocephalic and atraumatic.     Mouth/Throat:     Mouth: Mucous membranes are moist.     Pharynx: Oropharynx is clear.  Eyes:     Extraocular Movements: Extraocular movements intact.     Conjunctiva/sclera: Conjunctivae normal.     Pupils: Pupils are equal, round, and reactive to light.  Cardiovascular:     Rate and Rhythm: Normal rate and regular rhythm.     Pulses: Normal pulses.     Heart sounds: Normal heart sounds.  Pulmonary:     Effort: Pulmonary effort is normal.     Breath sounds: Normal breath sounds.  Abdominal:     Tenderness: There is no right CVA tenderness.  Musculoskeletal:        General: Normal range of motion.     Cervical back: Normal range of motion and neck  supple.  Skin:    General: Skin is warm and dry.  Neurological:     General: No focal deficit present.     Mental Status: He is alert and oriented to person, place, and time. Mental status is at baseline.  Psychiatric:        Mood and Affect: Mood normal.        Behavior: Behavior normal.        Thought Content: Thought content normal.     UC Treatments / Results  Labs (all labs ordered are listed, but only abnormal results are displayed) Labs Reviewed  POCT URINALYSIS DIP (MANUAL ENTRY) - Abnormal; Notable for the following components:      Result Value   pH, UA 8.5 (*)    All other components within normal limits  CYTOLOGY, (ORAL, ANAL, URETHRAL) ANCILLARY ONLY    EKG   Radiology No results found.  Procedures Procedures (including critical care time)  Medications Ordered in UC Medications - No data to display  Initial Impression / Assessment and Plan / UC Course  I have reviewed the triage vital signs and the nursing notes.  Pertinent labs & imaging results that were available during my care of the patient were reviewed by me and considered in my medical decision making (see chart for details).     MDM: 1.  Dysuria-Rx'd Bactrim. Advised patient to take medication as directed with food to completion.  Advised patient we will follow-up with Aptima swab results once received.  Patient discharged home, hemodynamically stable. Final Clinical Impressions(s) / UC Diagnoses   Final diagnoses:  Dysuria     Discharge Instructions      Advised patient to take medication as directed with food to completion.  Advised patient we will follow-up with Aptima swab results once received.     ED Prescriptions     Medication Sig Dispense Auth. Provider   sulfamethoxazole-trimethoprim (BACTRIM DS) 800-160 MG tablet Take 1 tablet by mouth 2 (two) times daily for 3 days. 6 tablet Eliezer Lofts, FNP      PDMP not reviewed this encounter.   Eliezer Lofts, [place] 10/27/20  1356

## 2020-10-27 NOTE — Discharge Instructions (Addendum)
Advised patient to take medication as directed with food to completion.  Advised patient we will follow-up with Aptima swab results once received.

## 2020-10-27 NOTE — Progress Notes (Signed)
Based on what you shared with me, I feel your condition warrants further evaluation and I recommend that you be seen in a face to face visit.  You need to go in person and be tested for chlamydia and gonorrhea.  You will also be able to receive treatment there.     NOTE: There will be NO CHARGE for this eVisit   If you are having a true medical emergency please call 911.      For an urgent face to face visit, [place] has six urgent care centers for your convenience:     [facility] [place] [facility] [place] Get Driving Directions [phone] [address] [place], [place] [address]    [facility] [place] [facility]) Get Driving Directions [phone] [place], [place] [address]  [facility] [place]) Get Driving Directions [phone] [address] [place],  [place]  [address]  [facility] Get Driving Directions [phone] [place], [place], [place] [address]   [facility] Get Driving Directions  [phone] [address], [place] [address]   [facility] [place] Get Driving Directions [phone] [address], [place], [place] [address]  Your MyChart E-visit questionnaire answers were reviewed by a board certified advanced clinical practitioner to complete your personal care plan based on your specific symptoms.  Thank you for using e-Visits.

## 2020-10-30 LAB — CYTOLOGY, (ORAL, ANAL, URETHRAL) ANCILLARY ONLY
Chlamydia: NEGATIVE
Comment: NEGATIVE
Comment: NEGATIVE
Comment: NORMAL
Neisseria Gonorrhea: POSITIVE — AB
Trichomonas: NEGATIVE

## 2020-10-30 NOTE — Telephone Encounter (Signed)
Absolutely!!  I will take care of her today.  Thanks for letting me know.

## 2020-10-31 ENCOUNTER — Telehealth (HOSPITAL_COMMUNITY): Payer: Self-pay | Admitting: Emergency Medicine

## 2020-10-31 NOTE — Telephone Encounter (Signed)
Per protocol, patient will need treatment with IM Rocephin 500mg  for positive Gonorrhea.   Attempted to reach patient x 1, [facility] notified

## 2020-11-01 ENCOUNTER — Other Ambulatory Visit: Payer: Self-pay

## 2020-11-01 ENCOUNTER — Ambulatory Visit
Admission: EM | Admit: 2020-11-01 | Discharge: 2020-11-01 | Disposition: A | Discharge status: Home / Self Care | Payer: BC Managed Care – PPO | Attending: Internal Medicine | Admitting: Internal Medicine

## 2020-11-01 DIAGNOSIS — A549 Gonococcal infection, unspecified: Secondary | ICD-10-CM

## 2020-11-01 MED ORDER — CEFTRIAXONE SODIUM 500 MG IJ SOLR
500.0000 mg | Freq: Once | INTRAMUSCULAR | Status: AC
  Administered 2020-11-01: 500 mg via INTRAMUSCULAR

## 2020-11-12 ENCOUNTER — Encounter: Payer: Self-pay | Admitting: Podiatry

## 2020-11-12 ENCOUNTER — Other Ambulatory Visit: Payer: Self-pay

## 2020-11-12 ENCOUNTER — Ambulatory Visit (INDEPENDENT_AMBULATORY_CARE_PROVIDER_SITE_OTHER): Payer: BC Managed Care – PPO

## 2020-11-12 ENCOUNTER — Ambulatory Visit: Payer: BC Managed Care – PPO | Admitting: Podiatry

## 2020-11-12 VITALS — BP 127/84 | HR 91 | Temp 99.7°F | Resp 16

## 2020-11-12 DIAGNOSIS — L923 Foreign body granuloma of the skin and subcutaneous tissue: Secondary | ICD-10-CM

## 2020-11-12 DIAGNOSIS — L84 Corns and callosities: Secondary | ICD-10-CM

## 2020-11-12 NOTE — Patient Instructions (Signed)
Look for urea 95% or salicylic acid cream or ointment and apply to the lesion. This can be bought over the counter, at a pharmacy or online such as [employer]. Aperture pads can also be found on [place] and will take pressure off the area

## 2020-11-12 NOTE — Progress Notes (Signed)
  Subjective:  Patient ID: Richard Love, male    DOB: 1995/05/16,  MRN: [id number]  Chief Complaint  Patient presents with   Foot Pain    "There's pain right here (Plantar Tarsal 4th ).  The doctor at Ortho said it was a callus that needed to be removed."    25 y.o. male presents with the above complaint. History confirmed with patient.  Previously thinks he stepped on a toothpick in this area and he was younger and he got a wound that got infected to go on to heal  Objective:  Physical Exam: warm, good capillary refill, no trophic changes or ulcerative lesions, normal DP and PT pulses, and normal sensory exam. Left Foot: Midfoot plantar hyperkeratotic scar tissue from prior ulceration and wound   Radiographs: Multiple views x-ray of the left foot: No retained foreign body Assessment:   1. Foreign body, granuloma, skin      Plan:  Patient was evaluated and treated and all questions answered.  Debrided lesion with a #15 blade to a tolerable level.  I think he can try offloading with aperture pads and possibly salicylic acid or urea cream with which I recommended.  Also discussed with him surgical excision of this.  He currently works as a Patent examiner and bus driver so right now this is not a viable option for him.  He will return to see me as needed for this  Return if symptoms worsen or fail to improve.

## 2020-12-17 ENCOUNTER — Telehealth: Payer: BC Managed Care – PPO | Admitting: Physician Assistant

## 2020-12-17 DIAGNOSIS — R369 Urethral discharge, unspecified: Secondary | ICD-10-CM

## 2020-12-17 NOTE — Progress Notes (Signed)
E-Visit for Urinary Problems  Based on what you shared with me, I feel your condition warrants further evaluation and I recommend that you be seen for a face to face office visit.    I do feel it would be best for you to be seen in person and undergo testing (test for cure). It is not uncommon for gonorrhea to be resistant to antibiotic treatments, thus you should undergo further testing to see if infection is still present.  We recommend that you see a provider today.  If your doctor's office is closed [facility] has the following Urgent Cares:    NOTE: You will not be charged for this e-visit.  If you are having a true medical emergency please call 911.       For an urgent face to face visit, [facility] has six urgent care centers for your convenience:     [facility] [place] [facility] [place] Get Driving Directions [phone] [place], [place] [address]    [facility]) Get Driving Directions [phone] [place], [place] [address]  [facility] [place] [facility]) Get Driving Directions [phone] [address],  [place]  [address]  [facility] Get Driving Directions [phone] [address] [place], [place], [place] [address]   [facility] Get Driving Directions  [phone] [address], [place] [address]   [facility] [place] Get Driving Directions [phone] [address] [place], [place] [address]  Your MyChart E-visit questionnaire answers were reviewed by a board certified advanced clinical practitioner to complete your personal care plan based on your specific symptoms.  Thank you for using e-Visits.   I provided 5 minutes of non face-to-face time during this encounter for chart review and documentation.

## 2021-02-06 ENCOUNTER — Ambulatory Visit: Payer: BC Managed Care – PPO | Admitting: Internal Medicine

## 2021-02-09 ENCOUNTER — Telehealth: Payer: BC Managed Care – PPO | Admitting: Nurse Practitioner

## 2021-02-09 ENCOUNTER — Encounter: Payer: Self-pay | Admitting: Nurse Practitioner

## 2021-02-09 DIAGNOSIS — U071 COVID-19: Secondary | ICD-10-CM | POA: Diagnosis not present

## 2021-02-09 MED ORDER — FLUTICASONE PROPIONATE 50 MCG/ACT NA SUSP: 2.0000 | Freq: Every day | NASAL | 0 refills | Status: DC

## 2021-02-09 NOTE — Progress Notes (Signed)
E-Visit  for Positive Covid Test Result  We are sorry you are not feeling well. We are here to help!  You have tested positive for COVID-19, meaning that you were infected with the novel coronavirus and could give the virus to others.  It is vitally important that you stay home so you do not spread it to others.      Please continue isolation at home, for at least 10 days since the start of your symptoms and until you have had 24 hours with no fever (without taking a fever reducer) and with improving of symptoms.  If you have no symptoms but tested positive (or all symptoms resolve after 5 days and you have no fever) you can leave your house but continue to wear a mask around others for an additional 5 days. If you have a fever,continue to stay home until you have had 24 hours of no fever. Most cases improve 5-10 days from onset but we have seen a small number of patients who have gotten worse after the 10 days.  Please be sure to watch for worsening symptoms and remain taking the proper precautions.   Go to the nearest hospital ED for assessment if fever/cough/breathlessness are severe or illness seems like a threat to life.    The following symptoms may appear 2-14 days after exposure: Fever Cough Shortness of breath or difficulty breathing Chills Repeated shaking with chills Muscle pain Headache Sore throat New loss of taste or smell Fatigue Congestion or runny nose Nausea or vomiting Diarrhea  You have been enrolled in [place] for COVID-19. Daily you will receive a questionnaire within the [facility] website. Our COVID-19 response team will be monitoring your responses daily.  You can use medication such as prescription for Fluticasone nasal spray 2 sprays in each nostril one time per day  You may also take acetaminophen (Tylenol) as needed for fever.  HOME CARE: Only take medications as instructed by your medical team. Drink plenty of fluids and get plenty of  rest. A steam or ultrasonic humidifier can help if you have congestion.   GET HELP RIGHT AWAY IF YOU HAVE EMERGENCY WARNING SIGNS.  Call 911 or proceed to your closest emergency facility if: You develop worsening high fever. Trouble breathing Bluish lips or face Persistent pain or pressure in the chest New confusion Inability to wake or stay awake You cough up blood. Your symptoms become more severe Inability to hold down food or fluids  This list is not all possible symptoms. Contact your medical provider for any symptoms that are severe or concerning to you.    Your e-visit answers were reviewed by a board certified advanced clinical practitioner to complete your personal care plan.  Depending on the condition, your plan could have included both over the counter or prescription medications.  If there is a problem please reply once you have received a response from your provider.  Your safety is important to [place].  If you have drug allergies check your prescription carefully.    You can use MyChart to ask questions about today's visit, request a non-urgent call back, or ask for a work or school excuse for 24 hours related to this e-Visit. If it has been greater than 24 hours you will need to follow up with your provider, or enter a new e-Visit to address those concerns. You will get an e-mail in the next two days asking about your experience.  I hope that your e-visit  has been valuable and will speed your recovery. Thank you for using e-visits.

## 2021-02-09 NOTE — Progress Notes (Signed)
I have spent 5 minutes in review of e-visit questionnaire, review and updating patient chart, medical decision making and response to patient.  ° °Baylon Santelli W Hershal Eriksson, NP ° °  °

## 2021-05-13 ENCOUNTER — Encounter: Payer: Self-pay | Admitting: Podiatry

## 2021-05-13 ENCOUNTER — Ambulatory Visit: Payer: BC Managed Care – PPO | Admitting: Podiatry

## 2021-05-13 DIAGNOSIS — D2371 Other benign neoplasm of skin of right lower limb, including hip: Secondary | ICD-10-CM

## 2021-05-13 NOTE — Progress Notes (Signed)
?  Subjective:  ?Patient ID: Richard Love, male    DOB: 14-Dec-1995,  MRN: [id number] ? ?Chief Complaint  ?Patient presents with  ? Foot Pain  ?   having foot pain right foot still going on  ? ? ?26 y.o. male presents with the above complaint. History confirmed with patient.  Skin lesion is still quite painful its not getting better ? ?Objective:  ?Physical Exam: ?warm, good capillary refill, no trophic changes or ulcerative lesions, normal DP and PT pulses, and normal sensory exam. ?Left Foot: Midfoot plantar hyperkeratotic scar tissue from prior ulceration and wound ? ? ?Radiographs: ?Multiple views x-ray of the left foot: No retained foreign body ?Assessment:  ? ?1. Foreign body, granuloma, skin   ? ? ? ?Plan:  ?Patient was evaluated and treated and all questions answered. ? ?We again discussed further treatment for the do not think intermittent debridement will be successful to alleviate this is a deep skin lesion that is penetrating through the dermis and is a permanent scar.  We discussed excision of this and the risk benefits and potential complications that could arise from this including but not limited to  pain, swelling, infection, scar, numbness which may be temporary or permanent, chronic pain, stiffness, nerve pain or damage, wound healing problems or painful scar.  All questions were addressed.  Informed consent was signed and reviewed.  We discussed the postoperative course including a period of nonweightbearing in a cam walker boot.  This boot was also dispensed today.  ? ? ?Surgical plan: ? ?Procedure: ?-Excision of skin lesion plantar right foot ? ?Location: ?-[facility] ? ?Anesthesia plan: ?-IV sedation with local ? ?Postoperative pain plan: ?- Tylenol 1000 mg every 6 hours, ibuprofen 600 mg every 6 hours, gabapentin 300 mg every 8 hours x5 days, oxycodone 5 mg 1-2 tabs every 6 hours only as needed ? ?DVT prophylaxis: ?-None required ? ?WB Restrictions / DME needs: ?-NWB in cam walker boot which was  dispensed today ? ? ?No follow-ups on file.  ? ? ?

## 2021-05-22 ENCOUNTER — Telehealth: Payer: Self-pay | Admitting: Urology

## 2021-05-22 NOTE — Telephone Encounter (Signed)
DOS - 06/21/21 ? ?EXC BENIGN LESION RIGHT --- 04753 ? ?[employer] EFFECTIVE DATE - 01/13/21 ? ?PLAN DEDUCTIBLE - $1,500.00 W/ $1,500.00 REMAINING ?OUT OF POCKET - $5,900.00 W/ $5,900.00 REMAINING ?COINSURANCE - 30% ?COPAY - $0.00 ? ?NO PRIOR AUTH IS REQUIRED ?

## 2021-06-21 ENCOUNTER — Other Ambulatory Visit: Payer: Self-pay | Admitting: Podiatry

## 2021-06-21 ENCOUNTER — Telehealth: Payer: Self-pay | Admitting: *Deleted

## 2021-06-21 DIAGNOSIS — L910 Hypertrophic scar: Secondary | ICD-10-CM | POA: Diagnosis not present

## 2021-06-21 MED ORDER — IBUPROFEN 800 MG PO TABS: 800.0000 mg | ORAL_TABLET | Freq: Three times a day (TID) | ORAL | 0 refills | Status: AC | PRN

## 2021-06-21 MED ORDER — HYDROCODONE-ACETAMINOPHEN 5-325 MG PO TABS: 1.0000 | ORAL_TABLET | Freq: Four times a day (QID) | ORAL | 0 refills | Status: AC | PRN

## 2021-06-21 NOTE — Telephone Encounter (Signed)
Nashae, pharmacist is calling for clarification on Norco/ Vicodin,5/325 mg. If he is to take according to directions given,will run out of the medicine within 5 days.Please advise.

## 2021-06-21 NOTE — Progress Notes (Signed)
06/21/21 excision scar right foot

## 2021-06-26 ENCOUNTER — Ambulatory Visit (INDEPENDENT_AMBULATORY_CARE_PROVIDER_SITE_OTHER): Payer: BC Managed Care – PPO | Admitting: Podiatry

## 2021-06-26 DIAGNOSIS — L923 Foreign body granuloma of the skin and subcutaneous tissue: Secondary | ICD-10-CM

## 2021-06-26 DIAGNOSIS — D2371 Other benign neoplasm of skin of right lower limb, including hip: Secondary | ICD-10-CM

## 2021-06-29 NOTE — Progress Notes (Signed)
  Subjective:  Patient ID: Richard Love, male    DOB: 06/16/1995,  MRN: [id number]  Chief Complaint  Patient presents with   Routine Post Op      POV #1 DOS 06/21/2021 EXCISION SKIN LESION RT FOOT     26 y.o. male returns for post-op check.  Doing well has not had any pain really  Review of Systems: Negative except as noted in the HPI. Denies N/V/F/Ch.   Objective:  There were no vitals filed for this visit. There is no height or weight on file to calculate BMI. Constitutional Well developed. Well nourished.  Vascular Foot warm and well perfused. Capillary refill normal to all digits.  Calf is soft and supple, no posterior calf or knee pain, negative Homans' sign  Neurologic Normal speech. Oriented to person, place, and time. Epicritic sensation to light touch grossly present bilaterally.  Dermatologic Skin healing well without signs of infection. Skin edges well coapted without signs of infection.  Orthopedic: Tenderness to palpation noted about the surgical site.   Assessment:  No diagnosis found. Plan:  Patient was evaluated and treated and all questions answered.  S/p foot surgery right -Progressing as expected post-operatively. -WB Status: NWB CAM boot with crutches -Sutures: Removed in 2 weeks. -Medications: No refills required -Foot redressed.  No follow-ups on file.

## 2021-07-08 ENCOUNTER — Ambulatory Visit (INDEPENDENT_AMBULATORY_CARE_PROVIDER_SITE_OTHER): Payer: BC Managed Care – PPO | Admitting: Podiatry

## 2021-07-08 DIAGNOSIS — D2371 Other benign neoplasm of skin of right lower limb, including hip: Secondary | ICD-10-CM

## 2021-07-10 ENCOUNTER — Encounter: Payer: BC Managed Care – PPO | Admitting: Podiatry

## 2021-07-10 ENCOUNTER — Encounter: Payer: Self-pay | Admitting: Podiatry

## 2021-07-10 NOTE — Progress Notes (Signed)
  Subjective:  Patient ID: Richard Love, male    DOB: 17-May-1995,  MRN: [id number]  Chief Complaint  Patient presents with   Routine Post Op     POV #2 DOS 06/21/2021 EXCISION SKIN LESION RT FOOT     26 y.o. male returns for post-op check.  Doing well  Review of Systems: Negative except as noted in the HPI. Denies N/V/F/Ch.   Objective:  There were no vitals filed for this visit. There is no height or weight on file to calculate BMI. Constitutional Well developed. Well nourished.  Vascular Foot warm and well perfused. Capillary refill normal to all digits.  Calf is soft and supple, no posterior calf or knee pain, negative Homans' sign  Neurologic Normal speech. Oriented to person, place, and time. Epicritic sensation to light touch grossly present bilaterally.  Dermatologic Skin healing well without signs of infection. Skin edges well coapted without signs of infection.  Orthopedic: Tenderness to palpation noted about the surgical site.   Assessment:   1. Benign neoplasm of skin of foot, right    Plan:  Patient was evaluated and treated and all questions answered.  S/p foot surgery right -Doing well all sutures removed he may begin WBAT in the cam boot.  I will see him back in 3 weeks for final follow-up No follow-ups on file.

## 2021-07-15 ENCOUNTER — Telehealth: Payer: BC Managed Care – PPO | Admitting: Urgent Care

## 2021-07-15 DIAGNOSIS — J020 Streptococcal pharyngitis: Secondary | ICD-10-CM | POA: Diagnosis not present

## 2021-07-15 MED ORDER — PENICILLIN V POTASSIUM 500 MG PO TABS: 500.0000 mg | ORAL_TABLET | Freq: Two times a day (BID) | ORAL | 0 refills | Status: AC

## 2021-07-15 NOTE — Progress Notes (Signed)

## 2021-07-31 ENCOUNTER — Encounter: Payer: BC Managed Care – PPO | Admitting: Podiatry

## 2021-08-16 ENCOUNTER — Telehealth: Payer: Self-pay | Admitting: Nurse Practitioner

## 2021-08-16 ENCOUNTER — Ambulatory Visit: Payer: BC Managed Care – PPO | Admitting: Nurse Practitioner

## 2021-08-16 NOTE — Telephone Encounter (Signed)
8.4.23 no show letter sent

## 2021-08-19 NOTE — Telephone Encounter (Signed)
1st no show, fee waived, letter sent 

## 2023-03-31 ENCOUNTER — Telehealth: Admitting: Physician Assistant

## 2023-03-31 DIAGNOSIS — R6889 Other general symptoms and signs: Secondary | ICD-10-CM

## 2023-03-31 MED ORDER — OSELTAMIVIR PHOSPHATE 75 MG PO CAPS: 75.0000 mg | ORAL_CAPSULE | Freq: Two times a day (BID) | ORAL | 0 refills | Status: AC

## 2023-03-31 NOTE — Progress Notes (Signed)

## 2023-03-31 NOTE — Progress Notes (Signed)
 I have spent 5 minutes in review of e-visit questionnaire, review and updating patient chart, medical decision making and response to patient.   Piedad Climes, PA-C

## 2023-03-31 NOTE — Progress Notes (Signed)
 Message sent to patient requesting further input regarding current symptoms. Awaiting patient response.
